# Patient Record
Sex: Female | Born: 1955
Health system: Southern US, Community
[De-identification: ages and names within clinical notes are randomized; demographics above are authoritative.]

## PROBLEM LIST (undated history)

## (undated) DIAGNOSIS — R519 Headache, unspecified: Secondary | ICD-10-CM

## (undated) DIAGNOSIS — G8929 Other chronic pain: Secondary | ICD-10-CM

## (undated) DIAGNOSIS — M25519 Pain in unspecified shoulder: Secondary | ICD-10-CM

## (undated) DIAGNOSIS — R079 Chest pain, unspecified: Secondary | ICD-10-CM

## (undated) DIAGNOSIS — B009 Herpesviral infection, unspecified: Secondary | ICD-10-CM

## (undated) DIAGNOSIS — I499 Cardiac arrhythmia, unspecified: Secondary | ICD-10-CM

## (undated) DIAGNOSIS — K589 Irritable bowel syndrome without diarrhea: Secondary | ICD-10-CM

## (undated) DIAGNOSIS — C439 Malignant melanoma of skin, unspecified: Secondary | ICD-10-CM

## (undated) DIAGNOSIS — F419 Anxiety disorder, unspecified: Secondary | ICD-10-CM

## (undated) DIAGNOSIS — F329 Major depressive disorder, single episode, unspecified: Secondary | ICD-10-CM

## (undated) DIAGNOSIS — M8580 Other specified disorders of bone density and structure, unspecified site: Secondary | ICD-10-CM

## (undated) DIAGNOSIS — D259 Leiomyoma of uterus, unspecified: Secondary | ICD-10-CM

## (undated) DIAGNOSIS — F32A Depression, unspecified: Secondary | ICD-10-CM

## (undated) DIAGNOSIS — N6019 Diffuse cystic mastopathy of unspecified breast: Secondary | ICD-10-CM

## (undated) DIAGNOSIS — N83209 Unspecified ovarian cyst, unspecified side: Secondary | ICD-10-CM

## (undated) DIAGNOSIS — R51 Headache: Secondary | ICD-10-CM

## (undated) DIAGNOSIS — K649 Unspecified hemorrhoids: Secondary | ICD-10-CM

## (undated) DIAGNOSIS — R002 Palpitations: Secondary | ICD-10-CM

## (undated) DIAGNOSIS — T7840XA Allergy, unspecified, initial encounter: Secondary | ICD-10-CM

## (undated) HISTORY — DX: Allergy, unspecified, initial encounter: T78.40XA

## (undated) HISTORY — DX: Headache: R51

## (undated) HISTORY — DX: Malignant melanoma of skin, unspecified: C43.9

## (undated) HISTORY — DX: Depression, unspecified: F32.A

## (undated) HISTORY — DX: Herpesviral infection, unspecified: B00.9

## (undated) HISTORY — DX: Other specified disorders of bone density and structure, unspecified site: M85.80

## (undated) HISTORY — DX: Leiomyoma of uterus, unspecified: D25.9

## (undated) HISTORY — DX: Pain in unspecified shoulder: M25.519

## (undated) HISTORY — DX: Major depressive disorder, single episode, unspecified: F32.9

## (undated) HISTORY — DX: Chest pain, unspecified: R07.9

## (undated) HISTORY — DX: Palpitations: R00.2

## (undated) HISTORY — DX: Cardiac arrhythmia, unspecified: I49.9

## (undated) HISTORY — DX: Irritable bowel syndrome, unspecified: K58.9

## (undated) HISTORY — DX: Unspecified ovarian cyst, unspecified side: N83.209

## (undated) HISTORY — DX: Other chronic pain: G89.29

## (undated) HISTORY — DX: Diffuse cystic mastopathy of unspecified breast: N60.19

## (undated) HISTORY — DX: Anxiety disorder, unspecified: F41.9

## (undated) HISTORY — DX: Unspecified hemorrhoids: K64.9

## (undated) HISTORY — PX: ABLATION: SHX5711

## (undated) HISTORY — DX: Headache, unspecified: R51.9

## (undated) HISTORY — PX: NASAL SEPTUM SURGERY: SHX37

---

## 1998-04-27 ENCOUNTER — Emergency Department (HOSPITAL_COMMUNITY): Admission: EM | Admit: 1998-04-27 | Discharge: 1998-04-27 | Payer: Self-pay | Admitting: Emergency Medicine

## 1998-04-30 ENCOUNTER — Other Ambulatory Visit: Admission: RE | Admit: 1998-04-30 | Discharge: 1998-04-30 | Payer: Self-pay | Admitting: Obstetrics and Gynecology

## 1998-10-19 ENCOUNTER — Emergency Department (HOSPITAL_COMMUNITY): Admission: EM | Admit: 1998-10-19 | Discharge: 1998-10-19 | Payer: Self-pay | Admitting: Emergency Medicine

## 1998-11-18 ENCOUNTER — Emergency Department (HOSPITAL_COMMUNITY): Admission: EM | Admit: 1998-11-18 | Discharge: 1998-11-18 | Payer: Self-pay | Admitting: Emergency Medicine

## 1999-01-02 ENCOUNTER — Observation Stay (HOSPITAL_COMMUNITY): Admission: AD | Admit: 1999-01-02 | Discharge: 1999-01-03 | Payer: Self-pay | Admitting: Internal Medicine

## 1999-05-04 ENCOUNTER — Other Ambulatory Visit: Admission: RE | Admit: 1999-05-04 | Discharge: 1999-05-04 | Payer: Self-pay | Admitting: Obstetrics and Gynecology

## 2000-05-12 ENCOUNTER — Other Ambulatory Visit: Admission: RE | Admit: 2000-05-12 | Discharge: 2000-05-12 | Payer: Self-pay | Admitting: Obstetrics and Gynecology

## 2000-07-07 ENCOUNTER — Encounter: Payer: Self-pay | Admitting: Internal Medicine

## 2000-07-07 ENCOUNTER — Encounter: Admission: RE | Admit: 2000-07-07 | Discharge: 2000-07-07 | Payer: Self-pay | Admitting: Internal Medicine

## 2001-01-02 ENCOUNTER — Encounter (INDEPENDENT_AMBULATORY_CARE_PROVIDER_SITE_OTHER): Payer: Self-pay | Admitting: Specialist

## 2001-01-02 ENCOUNTER — Other Ambulatory Visit: Admission: RE | Admit: 2001-01-02 | Discharge: 2001-01-02 | Payer: Self-pay | Admitting: *Deleted

## 2001-05-16 ENCOUNTER — Other Ambulatory Visit: Admission: RE | Admit: 2001-05-16 | Discharge: 2001-05-16 | Payer: Self-pay | Admitting: Obstetrics and Gynecology

## 2001-05-19 ENCOUNTER — Encounter: Admission: RE | Admit: 2001-05-19 | Discharge: 2001-05-19 | Payer: Self-pay | Admitting: Internal Medicine

## 2001-05-19 ENCOUNTER — Encounter: Payer: Self-pay | Admitting: Internal Medicine

## 2002-05-22 ENCOUNTER — Encounter: Admission: RE | Admit: 2002-05-22 | Discharge: 2002-05-22 | Payer: Self-pay | Admitting: Internal Medicine

## 2002-05-22 ENCOUNTER — Encounter: Payer: Self-pay | Admitting: Internal Medicine

## 2004-07-01 ENCOUNTER — Encounter: Admission: RE | Admit: 2004-07-01 | Discharge: 2004-07-01 | Payer: Self-pay | Admitting: Obstetrics and Gynecology

## 2006-03-14 ENCOUNTER — Other Ambulatory Visit: Admission: RE | Admit: 2006-03-14 | Discharge: 2006-03-14 | Payer: Self-pay | Admitting: Gynecology

## 2006-04-04 ENCOUNTER — Encounter: Admission: RE | Admit: 2006-04-04 | Discharge: 2006-04-04 | Payer: Self-pay | Admitting: Gynecology

## 2007-03-21 ENCOUNTER — Other Ambulatory Visit: Admission: RE | Admit: 2007-03-21 | Discharge: 2007-03-21 | Payer: Self-pay | Admitting: Gynecology

## 2007-03-28 ENCOUNTER — Encounter: Admission: RE | Admit: 2007-03-28 | Discharge: 2007-03-28 | Payer: Self-pay | Admitting: Gynecology

## 2007-04-12 ENCOUNTER — Emergency Department (HOSPITAL_COMMUNITY): Admission: EM | Admit: 2007-04-12 | Discharge: 2007-04-12 | Payer: Self-pay | Admitting: Emergency Medicine

## 2008-04-15 ENCOUNTER — Other Ambulatory Visit: Admission: RE | Admit: 2008-04-15 | Discharge: 2008-04-15 | Payer: Self-pay | Admitting: Gynecology

## 2008-05-21 ENCOUNTER — Encounter: Admission: RE | Admit: 2008-05-21 | Discharge: 2008-05-21 | Payer: Self-pay | Admitting: Gynecology

## 2009-06-24 ENCOUNTER — Encounter: Admission: RE | Admit: 2009-06-24 | Discharge: 2009-06-24 | Payer: Self-pay | Admitting: Gynecology

## 2010-01-12 ENCOUNTER — Encounter: Admission: RE | Admit: 2010-01-12 | Discharge: 2010-01-12 | Payer: Self-pay | Admitting: Gastroenterology

## 2010-01-19 ENCOUNTER — Encounter: Admission: RE | Admit: 2010-01-19 | Discharge: 2010-01-19 | Payer: Self-pay | Admitting: Internal Medicine

## 2011-07-01 ENCOUNTER — Other Ambulatory Visit: Payer: Self-pay | Admitting: Gynecology

## 2011-07-01 ENCOUNTER — Other Ambulatory Visit: Payer: Self-pay | Admitting: Internal Medicine

## 2011-07-01 DIAGNOSIS — Z1231 Encounter for screening mammogram for malignant neoplasm of breast: Secondary | ICD-10-CM

## 2011-07-20 ENCOUNTER — Ambulatory Visit
Admission: RE | Admit: 2011-07-20 | Discharge: 2011-07-20 | Disposition: A | Discharge status: Home / Self Care | Payer: 59 | Source: Ambulatory Visit | Attending: Internal Medicine | Admitting: Internal Medicine

## 2011-07-20 DIAGNOSIS — Z1231 Encounter for screening mammogram for malignant neoplasm of breast: Secondary | ICD-10-CM

## 2012-01-25 ENCOUNTER — Other Ambulatory Visit: Payer: Self-pay | Admitting: Internal Medicine

## 2012-01-25 DIAGNOSIS — R911 Solitary pulmonary nodule: Secondary | ICD-10-CM

## 2012-02-01 ENCOUNTER — Ambulatory Visit
Admission: RE | Admit: 2012-02-01 | Discharge: 2012-02-01 | Disposition: A | Discharge status: Home / Self Care | Payer: 59 | Source: Ambulatory Visit | Attending: Internal Medicine | Admitting: Internal Medicine

## 2012-02-01 DIAGNOSIS — R911 Solitary pulmonary nodule: Secondary | ICD-10-CM

## 2012-08-02 ENCOUNTER — Other Ambulatory Visit: Payer: Self-pay | Admitting: Internal Medicine

## 2012-08-02 DIAGNOSIS — Z1231 Encounter for screening mammogram for malignant neoplasm of breast: Secondary | ICD-10-CM

## 2012-08-16 ENCOUNTER — Ambulatory Visit
Admission: RE | Admit: 2012-08-16 | Discharge: 2012-08-16 | Disposition: A | Discharge status: Home / Self Care | Payer: 59 | Source: Ambulatory Visit | Attending: Internal Medicine | Admitting: Internal Medicine

## 2012-08-16 DIAGNOSIS — Z1231 Encounter for screening mammogram for malignant neoplasm of breast: Secondary | ICD-10-CM

## 2013-01-24 ENCOUNTER — Other Ambulatory Visit: Payer: Self-pay | Admitting: Internal Medicine

## 2013-01-24 DIAGNOSIS — R911 Solitary pulmonary nodule: Secondary | ICD-10-CM

## 2013-01-29 ENCOUNTER — Ambulatory Visit
Admission: RE | Admit: 2013-01-29 | Discharge: 2013-01-29 | Disposition: A | Discharge status: Home / Self Care | Payer: 59 | Source: Ambulatory Visit | Attending: Internal Medicine | Admitting: Internal Medicine

## 2013-01-29 DIAGNOSIS — R911 Solitary pulmonary nodule: Secondary | ICD-10-CM

## 2013-07-27 ENCOUNTER — Other Ambulatory Visit: Payer: Self-pay

## 2013-07-27 DIAGNOSIS — Z1231 Encounter for screening mammogram for malignant neoplasm of breast: Secondary | ICD-10-CM

## 2013-08-22 ENCOUNTER — Ambulatory Visit
Admission: RE | Admit: 2013-08-22 | Discharge: 2013-08-22 | Disposition: A | Discharge status: Home / Self Care | Payer: 59 | Source: Ambulatory Visit

## 2013-08-22 DIAGNOSIS — Z1231 Encounter for screening mammogram for malignant neoplasm of breast: Secondary | ICD-10-CM

## 2014-11-26 DIAGNOSIS — R002 Palpitations: Secondary | ICD-10-CM

## 2014-11-26 DIAGNOSIS — R079 Chest pain, unspecified: Secondary | ICD-10-CM

## 2014-11-26 DIAGNOSIS — M25519 Pain in unspecified shoulder: Secondary | ICD-10-CM

## 2014-11-26 HISTORY — DX: Chest pain, unspecified: R07.9

## 2014-11-26 HISTORY — DX: Palpitations: R00.2

## 2014-11-26 HISTORY — DX: Pain in unspecified shoulder: M25.519

## 2014-12-17 ENCOUNTER — Other Ambulatory Visit: Payer: Self-pay

## 2014-12-17 DIAGNOSIS — Z1231 Encounter for screening mammogram for malignant neoplasm of breast: Secondary | ICD-10-CM

## 2014-12-18 ENCOUNTER — Ambulatory Visit
Admission: RE | Admit: 2014-12-18 | Discharge: 2014-12-18 | Disposition: A | Discharge status: Home / Self Care | Payer: 59 | Source: Ambulatory Visit

## 2014-12-18 DIAGNOSIS — Z1231 Encounter for screening mammogram for malignant neoplasm of breast: Secondary | ICD-10-CM

## 2014-12-25 ENCOUNTER — Ambulatory Visit (INDEPENDENT_AMBULATORY_CARE_PROVIDER_SITE_OTHER): Payer: 59 | Admitting: Internal Medicine

## 2014-12-25 ENCOUNTER — Encounter: Payer: Self-pay | Admitting: Internal Medicine

## 2014-12-25 VITALS — BP 124/82 | HR 75 | Ht 72.0 in | Wt 129.2 lb

## 2014-12-25 DIAGNOSIS — R002 Palpitations: Secondary | ICD-10-CM

## 2014-12-25 DIAGNOSIS — R0789 Other chest pain: Secondary | ICD-10-CM | POA: Insufficient documentation

## 2014-12-25 DIAGNOSIS — I471 Supraventricular tachycardia: Secondary | ICD-10-CM

## 2014-12-25 NOTE — Assessment & Plan Note (Signed)
The etiology of her palpitations is unclear. It would be extremely unlikely for her to have recurrent SVT. That she is only had a couple of episodes makes me think we should undergo watchful waiting. If her palpitations return and increase in frequency, I would recommend that she wear a cardiac monitor.

## 2014-12-25 NOTE — Assessment & Plan Note (Signed)
She has a history of SVT, and underwent successful catheter ablation in 2001. It seems unlikely that she has recurrent SVT. She will undergo watchful waiting. She is encouraged to avoid caffeine use or alcohol use.

## 2014-12-25 NOTE — Patient Instructions (Signed)
Your physician has requested that you have an exercise tolerance test. For further information please visit [email or web address]. Please also follow instruction sheet, as given.   

## 2014-12-25 NOTE — Assessment & Plan Note (Signed)
She has few if any risk factors for coronary disease. Her symptoms however are associated with exertion. She will undergo exercise treadmill testing. Additional recommendations will be made based on the results of her stress test.

## 2014-12-25 NOTE — Progress Notes (Signed)
HPI Sharon Potter returns today after a long absence from our arrhythmia clinic. She is a very pleasant 58 year old woman with a remote history of tachycardia palpitations and documented SVT. She underwent catheter ablation over 16 years ago. In the interim, she has done well until approximately 3 weeks ago. She had been under increased stress, and had not slept for a well and noted 3 episodes of tachycardia palpitations. Her episodes did not feel like they did when she underwent catheter ablation. In addition, she has had substernal chest pressure. This is at times related to standing or exertion, and typically resolves with rest. No other symptoms noted. She has not had syncope or peripheral edema. Her breathing has been okay.  Allergies  Allergen Reactions  . Penicillin G Benzathine     Severe rash   . Penicillins     Severe rash   . Peroxyl [Hydrogen Peroxide-Benzy Alc]     Severe facial swelling, bumps   . Fosamax [Alendronate]     HA  . Lopressor [Metoprolol Tartrate]     fatigue  . Sulfa Antibiotics     rash  . Sulfamethoxazole-Trimethoprim     rash     Current Outpatient Prescriptions  Medication Sig Dispense Refill  . fluticasone (FLONASE) 50 MCG/ACT nasal spray Place 1 spray into both nostrils daily.    . VENTOLIN HFA 108 (90 BASE) MCG/ACT inhaler Inhale 2 puffs into the lungs every 4 (four) hours as needed. Shortness of breath or wheezing     No current facility-administered medications for this visit.     Past Medical History  Diagnosis Date  . Depression     no meds  . Anxiety   . IBS (irritable bowel syndrome)     Dr.Robert Vertell Limber in [facility] 2006 colon and EGD normal  CT 2011  . Arrhythmia     history of SVT status post ablation in 2000   . Chronic headache   . HSV (herpes simplex virus) infection     outbreaks  . Fibrocystic disease of breast   . Hemorrhoids   . Ovarian cyst     left followed by Dr Sela Hilding   . Skin melanoma     on back  superficial only, Dr Allyson Sabal  . Uterine fibroid     CT scan 1/11  . Allergy     RAD - PRN ALB MDI   . Osteopenia     BMD- 3/13  . Palpitations 11/26/2014    c/o HR slow and fast, last 2-3 weeks. Had electrical ablation in 2001  . Chest pain 11/26/2014  . Shoulder pain 11/26/2014    ROS:   All systems reviewed and negative except as noted in the HPI.   Past Surgical History  Procedure Laterality Date  . Ablation  2000 OR 2001  . Nasal septum surgery      repair     Family History  Problem Relation Age of Onset  . Cancer Mother     lung  . Heart Problems Father     MVR and MVP  . Cancer Father     skin  . Cancer Maternal Aunt     breast  . Cancer Maternal Grandmother     breast  . Pulmonary embolism Paternal Grandfather   . Cancer Daughter     skin  . Cancer Maternal Aunt     breast     History   Social History  . Marital Status: Married  Spouse Name: N/A    Number of Children: N/A  . Years of Education: N/A   Occupational History  . Not on file.   Social History Main Topics  . Smoking status: Never Smoker   . Smokeless tobacco: Not on file  . Alcohol Use: 0.0 oz/week    0 Not specified per week     Comment: Heavy in the past. Wine 2 glasses occasionally   . Drug Use: Not on file  . Sexual Activity: Not on file   Other Topics Concern  . Not on file   Social History Narrative   Tobacco use cigarettes: Never smoked    No smoking   No Tobacco Exposure   Alcohol : yes Heavy in the past: wine 2 glasses    Caffeine: yes   No recreational drug use   No diet   No exercise   Occupation : employed work at [employer] , in [employer] in [place],  [place] Status  : Married    Children girls 1   Seat belt use :yes     BP 124/82 mmHg  Pulse 75  Ht 6' (1.829 m)  Wt 129 lb 3.2 oz (58.605 kg)  BMI 17.52 kg/m2  Physical Exam:  Well appearing 58 year old woman, NAD HEENT: Unremarkable Neck:  No JVD, no  thyromegally Lymphatics:  No adenopathy Back:  No CVA tenderness Lungs:  Clear, with no wheezes, rales, or rhonchi. HEART:  Regular rate rhythm, no murmurs, no rubs, no clicks Abd:  soft, positive bowel sounds, no organomegally, no rebound, no guarding Ext:  2 plus pulses, no edema, no cyanosis, no clubbing Skin:  No rashes no nodules Neuro:  CN II through XII intact, motor grossly intact  EKG - normal sinus rhythm, normal axis and intervals.  Assess/Plan:

## 2015-01-01 ENCOUNTER — Encounter: Payer: Self-pay | Admitting: Internal Medicine

## 2015-01-02 ENCOUNTER — Encounter: Payer: 59 | Admitting: Internal Medicine

## 2015-01-14 ENCOUNTER — Ambulatory Visit (INDEPENDENT_AMBULATORY_CARE_PROVIDER_SITE_OTHER): Payer: 59 | Admitting: Internal Medicine

## 2015-01-14 DIAGNOSIS — R002 Palpitations: Secondary | ICD-10-CM

## 2015-01-14 DIAGNOSIS — R0789 Other chest pain: Secondary | ICD-10-CM

## 2015-01-14 NOTE — Progress Notes (Signed)
Exercise Treadmill Test  Pre-Exercise Testing Evaluation Rhythm: normal sinus  Rate: 83 bpm     Test  Exercise Tolerance Test Ordering MD: Cristopher Peru, MD  Interpreting MD: Cristopher Peru, MD  Unique Test No: 1  Treadmill:  1  Indication for ETT: chest pain - rule out ischemia  Contraindication to ETT: No   Stress Modality: exercise - treadmill  Cardiac Imaging Performed: non   Protocol: standard Bruce - maximal  Max BP:  205/91  Max MPHR (bpm):  161 85% MPR (bpm):  137  MPHR obtained (bpm):  164 % MPHR obtained:  101  Reached 85% MPHR (min:sec):  9:21 Total Exercise Time (min-sec):  12:00  Workload in METS:  13.4 Borg Scale: 15  Reason ETT Terminated:  desired heart rate attained    ST Segment Analysis At Rest: normal ST segments - no evidence of significant ST depression With Exercise: no evidence of significant ST depression  Other Information Arrhythmia:  No Angina during ETT:  absent (0) Quality of ETT:  diagnostic  ETT Interpretation:  normal - no evidence of ischemia by ST analysis  Comments: Clinically and electrically negative  Recommendations: Continue current meds

## 2015-10-27 ENCOUNTER — Other Ambulatory Visit: Payer: Self-pay

## 2015-10-27 DIAGNOSIS — Z1231 Encounter for screening mammogram for malignant neoplasm of breast: Secondary | ICD-10-CM

## 2015-12-24 ENCOUNTER — Ambulatory Visit
Admission: RE | Admit: 2015-12-24 | Discharge: 2015-12-24 | Disposition: A | Discharge status: Home / Self Care | Payer: 59 | Source: Ambulatory Visit

## 2015-12-24 DIAGNOSIS — Z1231 Encounter for screening mammogram for malignant neoplasm of breast: Secondary | ICD-10-CM

## 2016-11-12 ENCOUNTER — Other Ambulatory Visit: Payer: Self-pay | Admitting: Gastroenterology

## 2016-12-29 ENCOUNTER — Other Ambulatory Visit: Payer: Self-pay | Admitting: Internal Medicine

## 2016-12-29 DIAGNOSIS — Z1159 Encounter for screening for other viral diseases: Secondary | ICD-10-CM | POA: Diagnosis not present

## 2016-12-29 DIAGNOSIS — Z0000 Encounter for general adult medical examination without abnormal findings: Secondary | ICD-10-CM | POA: Diagnosis not present

## 2016-12-29 DIAGNOSIS — R2232 Localized swelling, mass and lump, left upper limb: Secondary | ICD-10-CM

## 2016-12-29 DIAGNOSIS — R7309 Other abnormal glucose: Secondary | ICD-10-CM | POA: Diagnosis not present

## 2017-01-10 ENCOUNTER — Ambulatory Visit
Admission: RE | Admit: 2017-01-10 | Discharge: 2017-01-10 | Disposition: A | Discharge status: Home / Self Care | Payer: Commercial Managed Care - HMO | Source: Ambulatory Visit | Attending: Internal Medicine | Admitting: Internal Medicine

## 2017-01-10 DIAGNOSIS — R2232 Localized swelling, mass and lump, left upper limb: Secondary | ICD-10-CM | POA: Diagnosis not present

## 2017-02-24 DIAGNOSIS — L821 Other seborrheic keratosis: Secondary | ICD-10-CM | POA: Diagnosis not present

## 2017-02-24 DIAGNOSIS — D225 Melanocytic nevi of trunk: Secondary | ICD-10-CM | POA: Diagnosis not present

## 2017-02-24 DIAGNOSIS — L719 Rosacea, unspecified: Secondary | ICD-10-CM | POA: Diagnosis not present

## 2017-03-11 DIAGNOSIS — R5383 Other fatigue: Secondary | ICD-10-CM | POA: Diagnosis not present

## 2017-03-11 DIAGNOSIS — R21 Rash and other nonspecific skin eruption: Secondary | ICD-10-CM | POA: Diagnosis not present

## 2017-05-18 ENCOUNTER — Other Ambulatory Visit: Payer: Self-pay | Admitting: Internal Medicine

## 2017-05-18 DIAGNOSIS — Z1231 Encounter for screening mammogram for malignant neoplasm of breast: Secondary | ICD-10-CM

## 2017-05-25 ENCOUNTER — Ambulatory Visit
Admission: RE | Admit: 2017-05-25 | Discharge: 2017-05-25 | Disposition: A | Discharge status: Home / Self Care | Payer: Commercial Managed Care - HMO | Source: Ambulatory Visit | Attending: Internal Medicine | Admitting: Internal Medicine

## 2017-05-25 DIAGNOSIS — Z1231 Encounter for screening mammogram for malignant neoplasm of breast: Secondary | ICD-10-CM

## 2017-06-09 DIAGNOSIS — W57XXXA Bitten or stung by nonvenomous insect and other nonvenomous arthropods, initial encounter: Secondary | ICD-10-CM | POA: Diagnosis not present

## 2017-06-09 DIAGNOSIS — R5383 Other fatigue: Secondary | ICD-10-CM | POA: Diagnosis not present

## 2017-06-09 DIAGNOSIS — Z7689 Persons encountering health services in other specified circumstances: Secondary | ICD-10-CM | POA: Diagnosis not present

## 2017-06-09 DIAGNOSIS — S30860A Insect bite (nonvenomous) of lower back and pelvis, initial encounter: Secondary | ICD-10-CM | POA: Diagnosis not present

## 2017-07-27 DIAGNOSIS — Z01419 Encounter for gynecological examination (general) (routine) without abnormal findings: Secondary | ICD-10-CM | POA: Diagnosis not present

## 2017-07-27 DIAGNOSIS — Z6824 Body mass index (BMI) 24.0-24.9, adult: Secondary | ICD-10-CM | POA: Diagnosis not present

## 2017-08-09 DIAGNOSIS — L820 Inflamed seborrheic keratosis: Secondary | ICD-10-CM | POA: Diagnosis not present

## 2017-11-02 DIAGNOSIS — Z23 Encounter for immunization: Secondary | ICD-10-CM | POA: Diagnosis not present

## 2017-12-15 ENCOUNTER — Other Ambulatory Visit: Payer: Self-pay

## 2017-12-15 ENCOUNTER — Emergency Department (HOSPITAL_COMMUNITY)
Admission: EM | Admit: 2017-12-15 | Discharge: 2017-12-15 | Disposition: A | Discharge status: Home / Self Care | Payer: 59 | Attending: Physician Assistant | Admitting: Physician Assistant

## 2017-12-15 ENCOUNTER — Emergency Department (HOSPITAL_COMMUNITY): Payer: 59

## 2017-12-15 DIAGNOSIS — R000 Tachycardia, unspecified: Secondary | ICD-10-CM | POA: Diagnosis not present

## 2017-12-15 DIAGNOSIS — I471 Supraventricular tachycardia: Secondary | ICD-10-CM | POA: Diagnosis not present

## 2017-12-15 DIAGNOSIS — I469 Cardiac arrest, cause unspecified: Secondary | ICD-10-CM | POA: Diagnosis not present

## 2017-12-15 LAB — BASIC METABOLIC PANEL
ANION GAP: 8 (ref 5–15)
BUN: 14 mg/dL (ref 6–20)
CALCIUM: 9.5 mg/dL (ref 8.9–10.3)
CO2: 23 mmol/L (ref 22–32)
Chloride: 104 mmol/L (ref 101–111)
Creatinine, Ser: 1.02 mg/dL — ABNORMAL HIGH (ref 0.44–1.00)
GFR, EST NON AFRICAN AMERICAN: 58 mL/min — AB (ref >60)
Glucose, Bld: 167 mg/dL — ABNORMAL HIGH (ref 65–99)
Potassium: 4 mmol/L (ref 3.5–5.1)
SODIUM: 135 mmol/L (ref 135–145)

## 2017-12-15 LAB — CBC
HCT: 42.2 % (ref 36.0–46.0)
HEMOGLOBIN: 14.4 g/dL (ref 12.0–15.0)
MCH: 30.6 pg (ref 26.0–34.0)
MCHC: 34.1 g/dL (ref 30.0–36.0)
MCV: 89.8 fL (ref 78.0–100.0)
PLATELETS: 225 10*3/uL (ref 150–400)
RBC: 4.7 MIL/uL (ref 3.87–5.11)
RDW: 12.2 % (ref 11.5–15.5)
WBC: 8.5 10*3/uL (ref 4.0–10.5)

## 2017-12-15 MED ORDER — ADENOSINE 6 MG/2ML IV SOLN
INTRAVENOUS | Status: AC
  Filled 2017-12-15: qty 6

## 2017-12-15 MED ORDER — ADENOSINE 6 MG/2ML IV SOLN
12.0000 mg | Freq: Once | INTRAVENOUS | Status: AC
  Administered 2017-12-15: 12 mg via INTRAVENOUS

## 2017-12-15 MED ORDER — ADENOSINE 6 MG/2ML IV SOLN
6.0000 mg | Freq: Once | INTRAVENOUS | Status: AC
  Administered 2017-12-15: 6 mg via INTRAVENOUS

## 2017-12-15 NOTE — Discharge Instructions (Signed)
You were in SVT today.  We are able to convert you to  regular sinus rhythm.  Please follow-up with your cardiologist in the coming days.  We do not think that you require any medications at this time.  Please follow-up with any concerns.

## 2017-12-15 NOTE — ED Triage Notes (Signed)
Patient c/o sob and lightheadedness x 2 hours. SVT noted on EKG.

## 2017-12-15 NOTE — ED Provider Notes (Signed)
[facility] EMERGENCY DEPARTMENT Provider Note   CSN: [id number] Arrival date & time: 12/15/17  1328     History   Chief Complaint Chief Complaint  Patient presents with  . Tachycardia    HPI Sharon Potter is a 61 y.o. female.  HPI   Patient is a 61 year old female with history of SVT and ablation in 2000.  Patient found to have high heart rate today at noon.  Came here in the emergency department at 2 PM.  No known insults.  Patient is not on any controller medications at home.  Patient has no chest pain.  Is feeling of fluttering, uncomfortably fast heart rate.  Past Medical History:  Diagnosis Date  . Allergy    RAD - PRN ALB MDI   . Anxiety   . Arrhythmia    history of SVT status post ablation in 2000   . Chest pain 11/26/2014  . Chronic headache   . Depression    no meds  . Fibrocystic disease of breast   . Hemorrhoids   . HSV (herpes simplex virus) infection    outbreaks  . IBS (irritable bowel syndrome)    Dr.Robert Vertell Limber in [facility] 2006 colon and EGD normal  CT 2011  . Osteopenia    BMD- 3/13  . Ovarian cyst    left followed by Dr Sela Hilding   . Palpitations 11/26/2014   c/o HR slow and fast, last 2-3 weeks. Had electrical ablation in 2001  . Shoulder pain 11/26/2014  . Skin melanoma ([place])    on back superficial only, Dr Allyson Sabal  . Uterine fibroid    CT scan 1/11    Patient Active Problem List   Diagnosis Date Noted  . Palpitations 12/25/2014  . Pressure in chest 12/25/2014  . SVT (supraventricular tachycardia) ([place]) 12/25/2014    Past Surgical History:  Procedure Laterality Date  . ABLATION  2000 OR 2001  . NASAL SEPTUM SURGERY     repair    OB History    No data available       Home Medications    Prior to Admission medications   Medication Sig Start Date End Date Taking? Authorizing Provider  fluticasone (FLONASE) 50 MCG/ACT nasal spray Place 1 spray into both nostrils daily. 12/18/14   [provider]   VENTOLIN HFA 108 (90 BASE) MCG/ACT inhaler Inhale 2 puffs into the lungs every 4 (four) hours as needed. Shortness of breath or wheezing 12/18/14   [provider]    Family History Family History  Problem Relation Age of Onset  . Cancer Maternal Aunt        breast  . Cancer Maternal Aunt        breast  . Cancer Mother        lung  . Heart Problems Father        MVR and MVP  . Cancer Father        skin  . Cancer Maternal Grandmother        breast  . Pulmonary embolism Paternal Grandfather   . Cancer Daughter        skin    Social History Social History   Tobacco Use  . Smoking status: Never Smoker  Substance Use Topics  . Alcohol use: Yes    Alcohol/week: 0.0 oz    Comment: Heavy in the past. Wine 2 glasses occasionally   . Drug use: Not on file     Allergies   Penicillin  g benzathine; Penicillins; Peroxyl [hydrogen peroxide-benzy alc]; Fosamax [alendronate]; Lopressor [metoprolol tartrate]; Sulfa antibiotics; and Sulfamethoxazole-trimethoprim   Review of Systems Review of Systems  Constitutional: Positive for fatigue. Negative for activity change and fever.  Respiratory: Positive for shortness of breath.   Cardiovascular: Positive for palpitations. Negative for chest pain.  Gastrointestinal: Negative for abdominal pain.  All other systems reviewed and are negative.    Physical Exam Updated Vital Signs BP 136/89 (BP Location: Right Arm)   Pulse (!) 185   Temp 97.6 F (36.4 C) (Oral)   Resp 18   Ht 5\' 3"  (1.6 m)   Wt 61.2 kg (135 lb)   SpO2 97%   BMI 23.91 kg/m   Physical Exam  Constitutional: She is oriented to person, place, and time. She appears well-developed and well-nourished.  HENT:  Head: Normocephalic and atraumatic.  Eyes: Right eye exhibits no discharge.  Cardiovascular: Regular rhythm and normal heart sounds.  No murmur heard. Tachycardic.  Pulmonary/Chest: Effort normal and breath sounds normal. She has no wheezes. She has  no rales.  Abdominal: Soft. She exhibits no distension. There is no tenderness.  Neurological: She is oriented to person, place, and time.  Skin: Skin is warm and dry. She is not diaphoretic.  Psychiatric: She has a normal mood and affect.  Nursing note and vitals reviewed.    ED Treatments / Results  Labs (all labs ordered are listed, but only abnormal results are displayed) Labs Reviewed  BASIC METABOLIC PANEL - Abnormal; Notable for the following components:      Result Value   Glucose, Bld 167 (*)    Creatinine, Ser 1.02 (*)    GFR calc non Af Amer 58 (*)    All other components within normal limits  CBC    EKG  EKG Interpretation None       Radiology No results found.  Procedures .Cardioversion Date/Time: 12/15/2017 2:27 PM Performed by: Macarthur Critchley, MD Authorized by: Macarthur Critchley, MD   Consent:    Consent obtained:  Verbal   Consent given by:  Patient and spouse Pre-procedure details:    Cardioversion basis:  Emergent   Rhythm:  Supraventricular tachycardia   Electrode placement:  Anterior-posterior Patient sedated: No Attempt one:    Shock outcome:  Conversion to normal sinus rhythm Post-procedure details:    Patient status:  Awake   Patient tolerance of procedure:  Tolerated well, no immediate complications Comments:     6 mg and then 12 mg of adenosine.  Converted with 12 mg.      (including critical care time)  Medications Ordered in ED Medications  adenosine (ADENOCARD) 6 MG/2ML injection 6 mg (6 mg Intravenous Given 12/15/17 1353)  adenosine (ADENOCARD) 6 MG/2ML injection 12 mg (12 mg Intravenous Given 12/15/17 1356)     Initial Impression / Assessment and Plan / ED Course  I have reviewed the triage vital signs and the nursing notes.  Pertinent labs & imaging results that were available during my care of the patient were reviewed by me and considered in my medical decision making (see chart for details).      Patient is a 61 year old female with history of SVT and ablation in 2000.  Patient found to have high heart rate today at noon.  Came here in the emergency department at 2 PM.  No known insults.  Patient is not on any controller medications at home.  Patient has no chest pain.  Is feeling of fluttering, uncomfortably fast  heart rate.  2:27 PM Converted with 12 mg of adenosine.  Patient now comfortable.  Patient x-rays normal.  Baseline labs are normal.  We will have her follow-up with her cardiologist.  Final Clinical Impressions(s) / ED Diagnoses   Final diagnoses:  None    ED Discharge Orders    None       Macarthur Critchley, MD 12/16/17 [phone]

## 2017-12-15 NOTE — ED Notes (Signed)
Patient transported to X-ray via wheelchair 

## 2017-12-21 ENCOUNTER — Ambulatory Visit: Payer: 59 | Admitting: Internal Medicine

## 2017-12-21 ENCOUNTER — Encounter: Payer: Self-pay | Admitting: Internal Medicine

## 2017-12-21 VITALS — BP 124/80 | HR 68 | Ht 63.0 in | Wt 135.0 lb

## 2017-12-21 DIAGNOSIS — I471 Supraventricular tachycardia: Secondary | ICD-10-CM | POA: Diagnosis not present

## 2017-12-21 MED ORDER — METOPROLOL TARTRATE 50 MG PO TABS: 50.0000 mg | ORAL_TABLET | Freq: Every day | ORAL | 3 refills | Status: DC | PRN

## 2017-12-21 NOTE — Progress Notes (Signed)
HPI Sharon Potter returns today for ongoing evaluation of SVT and associated chest pressure. She is a 61 yo woman with a h/o SVT who underwent EP study and ablation over 18 years ago. I currently do not have access to those records. She presented to the ED less than a week ago with palpitations and chest pressure and was in SVT at 187/min. She was treated with 12 mg of IV adenosine restoring NSR. She admits to some difficulty with sleeping. No ETOH or caffeine excess. Allergies  Allergen Reactions  . Penicillins Hives, Swelling and Rash    Has patient had a PCN reaction causing immediate rash, facial/tongue/throat swelling, SOB or lightheadedness with hypotension: Yes Has patient had a PCN reaction causing severe rash involving mucus membranes or skin necrosis: Yes Has patient had a PCN reaction that required hospitalization: Yes Has patient had a PCN reaction occurring within the last 10 years: No If all of the above answers are "NO", then may proceed with Cephalosporin use.    Charmaine Downs Peroxide-Benzy Alc] Swelling and Rash    Severe facial swelling, bumps   . Fosamax [Alendronate] Other (See Comments)    HA  . Lopressor [Metoprolol Tartrate] Other (See Comments)    fatigue  . Sulfa Antibiotics Rash    rash     Current Outpatient Medications  Medication Sig Dispense Refill  . fluticasone (FLONASE) 50 MCG/ACT nasal spray Place 1 spray into both nostrils daily as needed for allergies.     . Probiotic Product (PROBIOTIC PO) Take 1 tablet by mouth daily as needed (supplement).    . VENTOLIN HFA 108 (90 BASE) MCG/ACT inhaler Inhale 2 puffs into the lungs every 4 (four) hours as needed. Shortness of breath or wheezing    . metoprolol tartrate (LOPRESSOR) 50 MG tablet Take 1 tablet (50 mg total) by mouth daily as needed (Do not take more than 1 tablet in 24 hours). 30 tablet 3   No current facility-administered medications for this visit.      Past Medical History:    Diagnosis Date  . Allergy    RAD - PRN ALB MDI   . Anxiety   . Arrhythmia    history of SVT status post ablation in 2000   . Chest pain 11/26/2014  . Chronic headache   . Depression    no meds  . Fibrocystic disease of breast   . Hemorrhoids   . HSV (herpes simplex virus) infection    outbreaks  . IBS (irritable bowel syndrome)    Dr.Robert Vertell Limber in [facility] 2006 colon and EGD normal  CT 2011  . Osteopenia    BMD- 3/13  . Ovarian cyst    left followed by Dr Sela Hilding   . Palpitations 11/26/2014   c/o HR slow and fast, last 2-3 weeks. Had electrical ablation in 2001  . Shoulder pain 11/26/2014  . Skin melanoma ([place])    on back superficial only, Dr Allyson Sabal  . Uterine fibroid    CT scan 1/11    ROS:   All systems reviewed and negative except as noted in the HPI.   Past Surgical History:  Procedure Laterality Date  . ABLATION  2000 OR 2001  . NASAL SEPTUM SURGERY     repair     Family History  Problem Relation Age of Onset  . Cancer Maternal Aunt        breast  . Cancer Maternal Aunt  breast  . Cancer Mother        lung  . Heart Problems Father        MVR and MVP  . Cancer Father        skin  . Cancer Maternal Grandmother        breast  . Pulmonary embolism Paternal Grandfather   . Cancer Daughter        skin     Social History   Socioeconomic History  . Marital status: Married    Spouse name: Not on file  . Number of children: Not on file  . Years of education: Not on file  . Highest education level: Not on file  Social Needs  . Financial resource strain: Not on file  . Food insecurity - worry: Not on file  . Food insecurity - inability: Not on file  . Transportation needs - medical: Not on file  . Transportation needs - non-medical: Not on file  Occupational History  . Not on file  Tobacco Use  . Smoking status: Never Smoker  . Smokeless tobacco: Never Used  Substance and Sexual Activity  . Alcohol use: Yes    Alcohol/week: 0.0  oz    Comment: Heavy in the past. Wine 2 glasses occasionally   . Drug use: Not on file  . Sexual activity: Not on file  Other Topics Concern  . Not on file  Social History Narrative   Tobacco use cigarettes: Never smoked    No smoking   No Tobacco Exposure   Alcohol : yes Heavy in the past: wine 2 glasses    Caffeine: yes   No recreational drug use   No diet   No exercise   Occupation : employed work at [employer] , in [employer] in [place],  [place] Status  : Married    Children girls 1   Seat belt use :yes     BP 124/80   Pulse 68   Ht 5\' 3"  (1.6 m)   Wt 135 lb (61.2 kg)   SpO2 97%   BMI 23.91 kg/m   Physical Exam:  Well appearing NAD HEENT: Unremarkable Neck:  No JVD, no thyromegally Lymphatics:  No adenopathy Back:  No CVA tenderness Lungs:  Clear HEART:  Regular rate rhythm, no murmurs, no rubs, no clicks Abd:  soft, positive bowel sounds, no organomegally, no rebound, no guarding Ext:  2 plus pulses, no edema, no cyanosis, no clubbing Skin:  No rashes no nodules Neuro:  CN II through XII intact, motor grossly intact  EKG - none  Assess/Plan: 1. SVT - I have discussed the treatment options with the patient. PRN beta blocker, daily beta blocker and catheter ablation were all reviewed. We will start out with the prn beta blocker. 2. Chest tightness - this occurs only with SVT and not with exertion. She will undergo watchful waiting.  Mikle [email or web address].D.

## 2017-12-21 NOTE — Patient Instructions (Addendum)
Medication Instructions:  Your physician has recommended you make the following change in your medication: 1.  Start taking metoprolol tartrate 50 mg (one tablet) as needed for SVT.  Do not take more than one tablet daily.  Labwork: None ordered.  Testing/Procedures: None ordered.  Follow-Up: Your physician wants you to follow-up in: 3 months with Dr. Lovena Le.  Any Other Special Instructions Will Be Listed Below (If Applicable).  If you need a refill on your cardiac medications before your next appointment, please call your pharmacy.  Metoprolol tablets What is this medicine? METOPROLOL (me TOE proe lole) is a beta-blocker. Beta-blockers reduce the workload on the heart and help it to beat more regularly. This medicine is used to treat high blood pressure and to prevent chest pain. It is also used to after a heart attack and to prevent an additional heart attack from occurring. This medicine may be used for other purposes; ask your health care provider or pharmacist if you have questions. COMMON BRAND NAME(S): Lopressor What should I tell my health care provider before I take this medicine? They need to know if you have any of these conditions: -diabetes -heart or vessel disease like slow heart rate, worsening heart failure, heart block, sick sinus syndrome or Raynaud's disease -kidney disease -liver disease -lung or breathing disease, like asthma or emphysema -pheochromocytoma -thyroid disease -an unusual or allergic reaction to metoprolol, other beta-blockers, medicines, foods, dyes, or preservatives -pregnant or trying to get pregnant -breast-feeding How should I use this medicine? Take this medicine by mouth with a drink of water. Follow the directions on the prescription label. Take this medicine immediately after meals. Take your doses at regular intervals. Do not take more medicine than directed. Do not stop taking this medicine suddenly. This could lead to serious  heart-related effects. Talk to your pediatrician regarding the use of this medicine in children. Special care may be needed. Overdosage: If you think you have taken too much of this medicine contact a poison control center or emergency room at once. NOTE: This medicine is only for you. Do not share this medicine with others. What if I miss a dose? If you miss a dose, take it as soon as you can. If it is almost time for your next dose, take only that dose. Do not take double or extra doses. What may interact with this medicine? This medicine may interact with the following medications: -certain medicines for blood pressure, heart disease, irregular heart beat -certain medicines for depression like monoamine oxidase (MAO) inhibitors, fluoxetine, or paroxetine -clonidine -dobutamine -epinephrine -isoproterenol -reserpine This list may not describe all possible interactions. Give your health care provider a list of all the medicines, herbs, non-prescription drugs, or dietary supplements you use. Also tell them if you smoke, drink alcohol, or use illegal drugs. Some items may interact with your medicine. What should I watch for while using this medicine? Visit your doctor or health care professional for regular check ups. Contact your doctor right away if your symptoms worsen. Check your blood pressure and pulse rate regularly. Ask your health care professional what your blood pressure and pulse rate should be, and when you should contact them. You may get drowsy or dizzy. Do not drive, use machinery, or do anything that needs mental alertness until you know how this medicine affects you. Do not sit or stand up quickly, especially if you are an older patient. This reduces the risk of dizzy or fainting spells. Contact your doctor if these symptoms  continue. Alcohol may interfere with the effect of this medicine. Avoid alcoholic drinks. What side effects may I notice from receiving this medicine? Side  effects that you should report to your doctor or health care professional as soon as possible: -allergic reactions like skin rash, itching or hives -cold or numb hands or feet -depression -difficulty breathing -faint -fever with sore throat -irregular heartbeat, chest pain -rapid weight gain -swollen legs or ankles Side effects that usually do not require medical attention (report to your doctor or health care professional if they continue or are bothersome): -anxiety or nervousness -change in sex drive or performance -dry skin -headache -nightmares or trouble sleeping -short term memory loss -stomach upset or diarrhea -unusually tired This list may not describe all possible side effects. Call your doctor for medical advice about side effects. You may report side effects to FDA at 1-800-FDA-1088. Where should I keep my medicine? Keep out of the reach of children. Store at room temperature between 15 and 30 degrees C (59 and 86 degrees F). Throw away any unused medicine after the expiration date. NOTE: This sheet is a summary. It may not cover all possible information. If you have questions about this medicine, talk to your doctor, pharmacist, or health care provider.  2018 Elsevier/Gold Standard (2013-08-17 14:40:36)

## 2018-01-02 DIAGNOSIS — Z0000 Encounter for general adult medical examination without abnormal findings: Secondary | ICD-10-CM | POA: Diagnosis not present

## 2018-01-02 DIAGNOSIS — Z136 Encounter for screening for cardiovascular disorders: Secondary | ICD-10-CM | POA: Diagnosis not present

## 2018-01-02 DIAGNOSIS — R7309 Other abnormal glucose: Secondary | ICD-10-CM | POA: Diagnosis not present

## 2018-02-01 DIAGNOSIS — M810 Age-related osteoporosis without current pathological fracture: Secondary | ICD-10-CM | POA: Diagnosis not present

## 2018-02-01 DIAGNOSIS — M8588 Other specified disorders of bone density and structure, other site: Secondary | ICD-10-CM | POA: Diagnosis not present

## 2018-03-03 DIAGNOSIS — Z85820 Personal history of malignant melanoma of skin: Secondary | ICD-10-CM | POA: Diagnosis not present

## 2018-03-03 DIAGNOSIS — L570 Actinic keratosis: Secondary | ICD-10-CM | POA: Diagnosis not present

## 2018-03-03 DIAGNOSIS — D225 Melanocytic nevi of trunk: Secondary | ICD-10-CM | POA: Diagnosis not present

## 2018-03-03 DIAGNOSIS — L814 Other melanin hyperpigmentation: Secondary | ICD-10-CM | POA: Diagnosis not present

## 2018-03-13 DIAGNOSIS — J310 Chronic rhinitis: Secondary | ICD-10-CM | POA: Diagnosis not present

## 2018-03-13 DIAGNOSIS — M810 Age-related osteoporosis without current pathological fracture: Secondary | ICD-10-CM | POA: Diagnosis not present

## 2018-03-14 ENCOUNTER — Encounter: Payer: Self-pay | Admitting: Internal Medicine

## 2018-03-22 ENCOUNTER — Encounter (INDEPENDENT_AMBULATORY_CARE_PROVIDER_SITE_OTHER): Payer: Self-pay

## 2018-03-22 ENCOUNTER — Encounter: Payer: Self-pay | Admitting: Internal Medicine

## 2018-03-22 ENCOUNTER — Ambulatory Visit: Payer: 59 | Admitting: Internal Medicine

## 2018-03-22 VITALS — BP 124/72 | HR 80 | Ht 63.0 in | Wt 136.0 lb

## 2018-03-22 DIAGNOSIS — I471 Supraventricular tachycardia: Secondary | ICD-10-CM | POA: Diagnosis not present

## 2018-03-22 DIAGNOSIS — R079 Chest pain, unspecified: Secondary | ICD-10-CM | POA: Diagnosis not present

## 2018-03-22 MED ORDER — OMEPRAZOLE 40 MG PO CPDR
40.0000 mg | DELAYED_RELEASE_CAPSULE | Freq: Every day | ORAL | 3 refills | Status: DC
Start: 2018-03-22 — End: 2021-12-15

## 2018-03-22 NOTE — Patient Instructions (Signed)
Medication Instructions:  Your physician has recommended you make the following change in your medication:  1.  Start taking omeprazole 40 mg one tablet by mouth daily.  Labwork: None ordered.  Testing/Procedures: None ordered.  Follow-Up: Your physician wants you to follow-up in: 6 months with Dr. Lovena Le.   You will receive a reminder letter in the mail two months in advance. If you don't receive a letter, please call our office to schedule the follow-up appointment.  Any Other Special Instructions Will Be Listed Below (If Applicable).  If you need a refill on your cardiac medications before your next appointment, please call your pharmacy.

## 2018-03-22 NOTE — Progress Notes (Signed)
HPI Sharon Potter returns today for followup of her SVT. She is a pleasant 62 yo woman who had SVT and underwent catheter ablation over 18 years ago. She represented 3 months ago with first time recurrence of SVT which was treated in the ED with IV adenosine. In the interim, she has had 4 episodes of palpitations lasting less than 5-10 minutes. Her main complaint today is that she has non-exertional chest pain. She had a stress test 2 years ago which was negative for ischemia. She notes that the episodes last for a few minutes and are not associated with sob.  Allergies  Allergen Reactions  . Penicillins Hives, Swelling and Rash    Has patient had a PCN reaction causing immediate rash, facial/tongue/throat swelling, SOB or lightheadedness with hypotension: Yes Has patient had a PCN reaction causing severe rash involving mucus membranes or skin necrosis: Yes Has patient had a PCN reaction that required hospitalization: Yes Has patient had a PCN reaction occurring within the last 10 years: No If all of the above answers are "NO", then may proceed with Cephalosporin use.    Sharon Potter Peroxide-Benzy Alc] Swelling and Rash    Severe facial swelling, bumps   . Fosamax [Alendronate] Other (See Comments)    HA  . Lopressor [Metoprolol Tartrate] Other (See Comments)    fatigue  . Sulfa Antibiotics Rash    rash     Current Outpatient Medications  Medication Sig Dispense Refill  . fluticasone (FLONASE) 50 MCG/ACT nasal spray Place 1 spray into both nostrils daily as needed for allergies.     . metoprolol tartrate (LOPRESSOR) 50 MG tablet Take 1 tablet (50 mg total) by mouth daily as needed (Do not take more than 1 tablet in 24 hours). 30 tablet 3  . Probiotic Product (PROBIOTIC PO) Take 1 tablet by mouth daily as needed (supplement).    . VENTOLIN HFA 108 (90 BASE) MCG/ACT inhaler Inhale 2 puffs into the lungs every 4 (four) hours as needed. Shortness of breath or wheezing     No  current facility-administered medications for this visit.      Past Medical History:  Diagnosis Date  . Allergy    RAD - PRN ALB MDI   . Anxiety   . Arrhythmia    history of SVT status post ablation in 2000   . Chest pain 11/26/2014  . Chronic headache   . Depression    no meds  . Fibrocystic disease of breast   . Hemorrhoids   . HSV (herpes simplex virus) infection    outbreaks  . IBS (irritable bowel syndrome)    Dr.Robert Vertell Limber in [facility] 2006 colon and EGD normal  CT 2011  . Osteopenia    BMD- 3/13  . Ovarian cyst    left followed by Dr Sela Hilding   . Palpitations 11/26/2014   c/o HR slow and fast, last 2-3 weeks. Had electrical ablation in 2001  . Shoulder pain 11/26/2014  . Skin melanoma ([place])    on back superficial only, Dr Allyson Sabal  . Uterine fibroid    CT scan 1/11    ROS:   All systems reviewed and negative except as noted in the HPI.   Past Surgical History:  Procedure Laterality Date  . ABLATION  2000 OR 2001  . NASAL SEPTUM SURGERY     repair     Family History  Problem Relation Age of Onset  . Cancer Maternal Aunt  breast  . Cancer Maternal Aunt        breast  . Cancer Mother        lung  . Heart Problems Father        MVR and MVP  . Cancer Father        skin  . Cancer Maternal Grandmother        breast  . Pulmonary embolism Paternal Grandfather   . Cancer Daughter        skin     Social History   Socioeconomic History  . Marital status: Married    Spouse name: Not on file  . Number of children: Not on file  . Years of education: Not on file  . Highest education level: Not on file  Occupational History  . Not on file  Social Needs  . Financial resource strain: Not on file  . Food insecurity:    Worry: Not on file    Inability: Not on file  . Transportation needs:    Medical: Not on file    Non-medical: Not on file  Tobacco Use  . Smoking status: Never Smoker  . Smokeless tobacco: Never Used  Substance and Sexual  Activity  . Alcohol use: Yes    Alcohol/week: 0.0 oz    Comment: Heavy in the past. Wine 2 glasses occasionally   . Drug use: Not on file  . Sexual activity: Not on file  Lifestyle  . Physical activity:    Days per week: Not on file    Minutes per session: Not on file  . Stress: Not on file  Relationships  . Social connections:    Talks on phone: Not on file    Gets together: Not on file    Attends religious service: Not on file    Active member of club or organization: Not on file    Attends meetings of clubs or organizations: Not on file    Relationship status: Not on file  . Intimate partner violence:    Fear of current or ex partner: Not on file    Emotionally abused: Not on file    Physically abused: Not on file    Forced sexual activity: Not on file  Other Topics Concern  . Not on file  Social History Narrative   Tobacco use cigarettes: Never smoked    No smoking   No Tobacco Exposure   Alcohol : yes Heavy in the past: wine 2 glasses    Caffeine: yes   No recreational drug use   No diet   No exercise   Occupation : employed work at [employer] , in [employer] in [place],  [place] Status  : Married    Children girls 1   Seat belt use :yes     Ht 5\' 3"  (1.6 m)   Wt 136 lb (61.7 kg)   BMI 24.09 kg/m   Physical Exam:  Well appearing 62 yo woman, NAD HEENT: Unremarkable Neck:  No JVD, no thyromegally Lymphatics:  No adenopathy Back:  No CVA tenderness Lungs:  Clear with no wheezes HEART:  Regular rate rhythm, no murmurs, no rubs, no clicks Abd:  soft, positive bowel sounds, no organomegally, no rebound, no guarding Ext:  2 plus pulses, no edema, no cyanosis, no clubbing Skin:  No rashes no nodules Neuro:  CN II through XII intact, motor grossly intact  EKG - none  Assess/Plan: 1. SVT - her symptoms are reasonably well  controlled. She has as needed metoprolol which she will continue. 2. Chest pain - I suspect esophageal spasm.  She has been given a prescription for omeprazole. 3. COPD - she has no active wheezing on exam. She will continue her bronchodilator therapy.  Mikle Bosworth.D.

## 2018-04-05 ENCOUNTER — Other Ambulatory Visit: Payer: Self-pay | Admitting: Internal Medicine

## 2018-04-05 DIAGNOSIS — R143 Flatulence: Secondary | ICD-10-CM | POA: Diagnosis not present

## 2018-04-05 DIAGNOSIS — K589 Irritable bowel syndrome without diarrhea: Secondary | ICD-10-CM | POA: Diagnosis not present

## 2018-04-05 DIAGNOSIS — Z1231 Encounter for screening mammogram for malignant neoplasm of breast: Secondary | ICD-10-CM

## 2018-05-05 DIAGNOSIS — R143 Flatulence: Secondary | ICD-10-CM | POA: Diagnosis not present

## 2018-05-05 DIAGNOSIS — K219 Gastro-esophageal reflux disease without esophagitis: Secondary | ICD-10-CM | POA: Diagnosis not present

## 2018-05-05 DIAGNOSIS — K589 Irritable bowel syndrome without diarrhea: Secondary | ICD-10-CM | POA: Diagnosis not present

## 2018-05-26 ENCOUNTER — Ambulatory Visit
Admission: RE | Admit: 2018-05-26 | Discharge: 2018-05-26 | Disposition: A | Discharge status: Home / Self Care | Payer: 59 | Source: Ambulatory Visit | Attending: Internal Medicine | Admitting: Internal Medicine

## 2018-05-26 DIAGNOSIS — Z1231 Encounter for screening mammogram for malignant neoplasm of breast: Secondary | ICD-10-CM | POA: Diagnosis not present

## 2018-06-20 DIAGNOSIS — C44319 Basal cell carcinoma of skin of other parts of face: Secondary | ICD-10-CM | POA: Diagnosis not present

## 2018-06-20 DIAGNOSIS — C44311 Basal cell carcinoma of skin of nose: Secondary | ICD-10-CM | POA: Diagnosis not present

## 2018-07-20 DIAGNOSIS — N183 Chronic kidney disease, stage 3 (moderate): Secondary | ICD-10-CM | POA: Diagnosis not present

## 2018-07-20 DIAGNOSIS — I471 Supraventricular tachycardia: Secondary | ICD-10-CM | POA: Diagnosis not present

## 2018-08-31 DIAGNOSIS — C44311 Basal cell carcinoma of skin of nose: Secondary | ICD-10-CM | POA: Diagnosis not present

## 2018-09-05 DIAGNOSIS — L821 Other seborrheic keratosis: Secondary | ICD-10-CM | POA: Diagnosis not present

## 2018-09-05 DIAGNOSIS — D485 Neoplasm of uncertain behavior of skin: Secondary | ICD-10-CM | POA: Diagnosis not present

## 2018-10-17 DIAGNOSIS — Z23 Encounter for immunization: Secondary | ICD-10-CM | POA: Diagnosis not present

## 2018-11-29 DIAGNOSIS — Z01419 Encounter for gynecological examination (general) (routine) without abnormal findings: Secondary | ICD-10-CM | POA: Diagnosis not present

## 2018-11-29 DIAGNOSIS — Z6823 Body mass index (BMI) 23.0-23.9, adult: Secondary | ICD-10-CM | POA: Diagnosis not present

## 2019-01-22 ENCOUNTER — Telehealth: Payer: Self-pay | Admitting: Internal Medicine

## 2019-01-22 MED ORDER — METOPROLOL TARTRATE 50 MG PO TABS: 50.0000 mg | ORAL_TABLET | Freq: Every day | ORAL | 3 refills | Status: DC | PRN

## 2019-01-22 NOTE — Telephone Encounter (Signed)
New message   Pt c/o medication issue:  1. Name of Medication: metoprolol   2. How are you currently taking this medication (dosage and times per day)? 50 mg takes as needed   3. Are you having a reaction (difficulty breathing--STAT)? no  4. What is your medication issue? PT says  her HR went up on Friday HR 177 according to her fit bit, so she took medication on Friday and HR went back down.  Pt is needing a new refill because hers is expired

## 2019-01-22 NOTE — Telephone Encounter (Signed)
Prescription refilled as requested.  Pt overdue for 6 month follow up with Dr. Lovena Le.  Will send to scheduling to call Pt.

## 2019-03-05 DIAGNOSIS — L814 Other melanin hyperpigmentation: Secondary | ICD-10-CM | POA: Diagnosis not present

## 2019-03-05 DIAGNOSIS — L821 Other seborrheic keratosis: Secondary | ICD-10-CM | POA: Diagnosis not present

## 2019-03-05 DIAGNOSIS — D225 Melanocytic nevi of trunk: Secondary | ICD-10-CM | POA: Diagnosis not present

## 2019-03-27 ENCOUNTER — Telehealth: Payer: Self-pay | Admitting: Internal Medicine

## 2019-03-27 NOTE — Telephone Encounter (Signed)
New message   Called patient about visit on 04.02.20 with Dr. Lovena Le. Patient would like phone call from Dr. Lovena Le, she said there is somethings she would like to discuss. Patient does not have access to computer or smart phone. The best number to reach patient is [phone]

## 2019-03-28 NOTE — Telephone Encounter (Signed)
done

## 2019-03-29 ENCOUNTER — Telehealth (INDEPENDENT_AMBULATORY_CARE_PROVIDER_SITE_OTHER): Payer: 59 | Admitting: Internal Medicine

## 2019-03-29 ENCOUNTER — Other Ambulatory Visit: Payer: Self-pay

## 2019-03-29 DIAGNOSIS — R002 Palpitations: Secondary | ICD-10-CM | POA: Diagnosis not present

## 2019-03-29 MED ORDER — METOPROLOL SUCCINATE ER 25 MG PO TB24: 25.0000 mg | ORAL_TABLET | Freq: Every day | ORAL | 3 refills | Status: DC

## 2019-03-29 NOTE — Progress Notes (Signed)
Electrophysiology TeleHealth Note   Due to national recommendations of social distancing due to [place] 19, an audio telehealth visit is felt to be most appropriate for this patient at this time.  See MyChart message from today for the patient's consent to telehealth for [facility]   Date:  03/29/2019   ID:  Sharon Potter, DOB May 24, 1956, MRN [id number]  Location: patient's home  Provider location: [address] [place]  Evaluation Performed: Follow-up visit  PCP:  Wenda Low, MD  Cardiologist:  No primary care provider on file.  Electrophysiologist:  Dr Lovena Le  Chief Complaint:  Palpitations  History of Present Illness:    Sharon Potter is a 63 y.o. female who presents via audio/video conferencing for a telehealth visit today.  She is a pleasant 63 yo woman with ah/o SVT, s/p ablation 19 years ago. In the interim, she had done well until experiencing a recurrent episode of sustained SVT a year ago. Since last being seen in our clinic, the patient reports that she is working regularly but has had some break through palpitations.  Today, she denies symptoms of shortness of breath,  lower extremity edema, dizziness, presyncope, or syncope.  The patient has non-cardiac chest pain. This has not worsened. The patient denies symptoms of fevers, chills, cough, or new SOB worrisome for COVID 19.  Past Medical History:  Diagnosis Date  . Allergy    RAD - PRN ALB MDI   . Anxiety   . Arrhythmia    history of SVT status post ablation in 2000   . Chest pain 11/26/2014  . Chronic headache   . Depression    no meds  . Fibrocystic disease of breast   . Hemorrhoids   . HSV (herpes simplex virus) infection    outbreaks  . IBS (irritable bowel syndrome)    Dr.Robert Vertell Limber in [facility] 2006 colon and EGD normal  CT 2011  . Osteopenia    BMD- 3/13  . Ovarian cyst    left followed by Dr Sela Hilding   . Palpitations 11/26/2014   c/o HR slow and fast, last 2-3 weeks.  Had electrical ablation in 2001  . Shoulder pain 11/26/2014  . Skin melanoma ([place])    on back superficial only, Dr Allyson Sabal  . Uterine fibroid    CT scan 1/11    Past Surgical History:  Procedure Laterality Date  . ABLATION  2000 OR 2001  . NASAL SEPTUM SURGERY     repair    Current Outpatient Medications  Medication Sig Dispense Refill  . fluticasone (FLONASE) 50 MCG/ACT nasal spray Place 1 spray into both nostrils daily as needed for allergies.     . metoprolol tartrate (LOPRESSOR) 50 MG tablet Take 1 tablet (50 mg total) by mouth daily as needed (Do not take more than 1 tablet in 24 hours). Please call to schedule appt with Dr Lovena Le 30 tablet 3  . omeprazole (PRILOSEC) 40 MG capsule Take 1 capsule (40 mg total) by mouth daily. 90 capsule 3  . Probiotic Product (PROBIOTIC PO) Take 1 tablet by mouth daily as needed (supplement).    . VENTOLIN HFA 108 (90 BASE) MCG/ACT inhaler Inhale 2 puffs into the lungs every 4 (four) hours as needed. Shortness of breath or wheezing     No current facility-administered medications for this visit.     Allergies:   Penicillins; Peroxyl [hydrogen peroxide-benzy alc]; Fosamax [alendronate]; Lopressor [metoprolol tartrate]; and Sulfa antibiotics   Social  History:  The patient  reports that she has never smoked. She has never used smokeless tobacco. She reports current alcohol use.   Family History:  The patient's  family history includes Cancer in her daughter, father, maternal aunt, maternal aunt, maternal grandmother, and mother; Heart Problems in her father; Pulmonary embolism in her paternal grandfather.   ROS:  Please see the history of present illness.   All other systems are personally reviewed and negative.    Exam:    Vital Signs:  There were no vitals taken for this visit.  Well appearing, alert and conversant, regular work of breathing,  good skin color Eyes- anicteric, neuro- grossly intact, skin- no apparent rash or lesions or  cyanosis, mouth- oral mucosa is pink   Labs/Other Tests and Data Reviewed:    Recent Labs: No results found for requested labs within last 8760 hours.   Wt Readings from Last 3 Encounters:  03/22/18 136 lb (61.7 kg)  12/21/17 135 lb (61.2 kg)  12/15/17 135 lb (61.2 kg)       ASSESSMENT & PLAN:    1.  Palpitations - the mechanism is unclear. I have recommended she take toprol 25 mg daily. I will see her in 4 months, sooner if she feels bafdly. 2. Chest pain - this remains non-exertional and I suspect musculoskeletal.  3. COVID 19 screen The patient denies symptoms of COVID 19 at this time.  The importance of social distancing was discussed today.  Follow-up:  4 months  Current medicines are reviewed at length with the patient today.   The patient does not have concerns regarding her medicines.  The following changes were made today:  none  Labs/ tests ordered today include: none No orders of the defined types were placed in this encounter.    Patient Risk:  after full review of this patients clinical status, I feel that they are at moderate risk at this time.  Today, I have spent 15 minutes with the patient with telehealth technology discussing her symptoms .    Signed, Cristopher Peru, MD  03/29/2019 11:17 AM     [facility] [address] [place]  [address] [phone] (office) [phone] (fax)

## 2019-10-10 ENCOUNTER — Other Ambulatory Visit: Payer: Self-pay | Admitting: Internal Medicine

## 2019-10-10 DIAGNOSIS — Z1231 Encounter for screening mammogram for malignant neoplasm of breast: Secondary | ICD-10-CM

## 2019-11-29 ENCOUNTER — Ambulatory Visit
Admission: RE | Admit: 2019-11-29 | Discharge: 2019-11-29 | Disposition: A | Discharge status: Home / Self Care | Payer: 59 | Source: Ambulatory Visit | Attending: Internal Medicine | Admitting: Internal Medicine

## 2019-11-29 ENCOUNTER — Other Ambulatory Visit: Payer: Self-pay

## 2019-11-29 DIAGNOSIS — Z1231 Encounter for screening mammogram for malignant neoplasm of breast: Secondary | ICD-10-CM

## 2019-12-05 IMAGING — MG DIGITAL SCREENING BILAT W/ TOMO W/ CAD
8 series · 9 of 24 positions shown · non-contrast
Comparison: Previous exam(s).

CLINICAL DATA: Screening.

EXAM:
DIGITAL SCREENING BILATERAL MAMMOGRAM WITH TOMO AND CAD

[R MLO synth-2D]
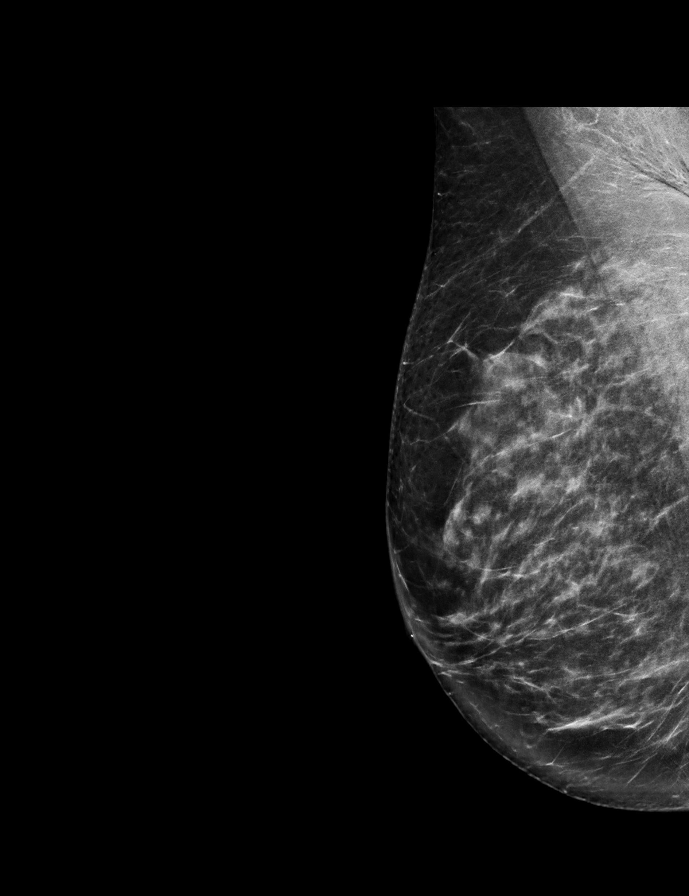

[L MLO synth-2D]
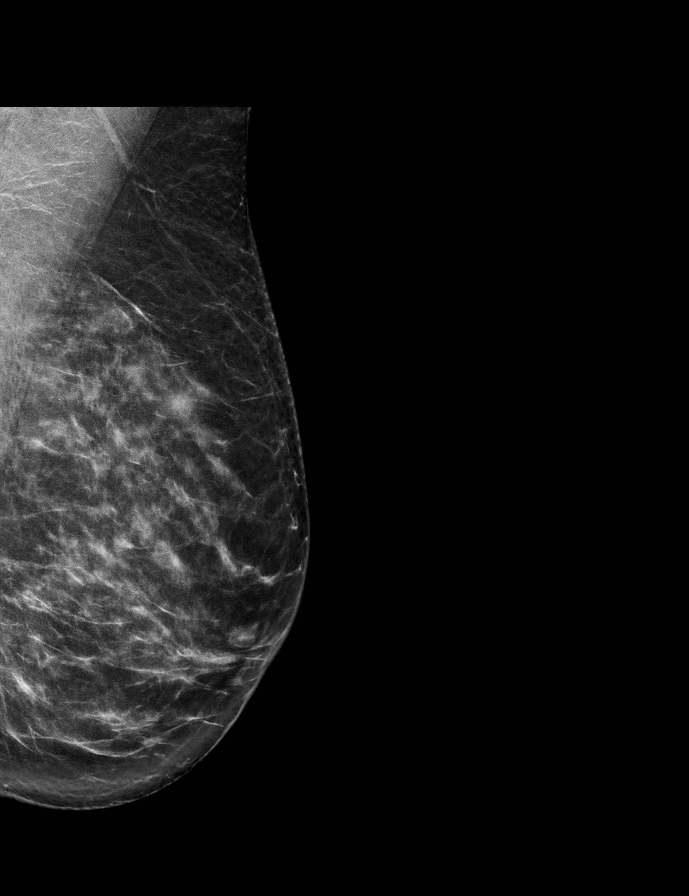

[L CC synth-2D]
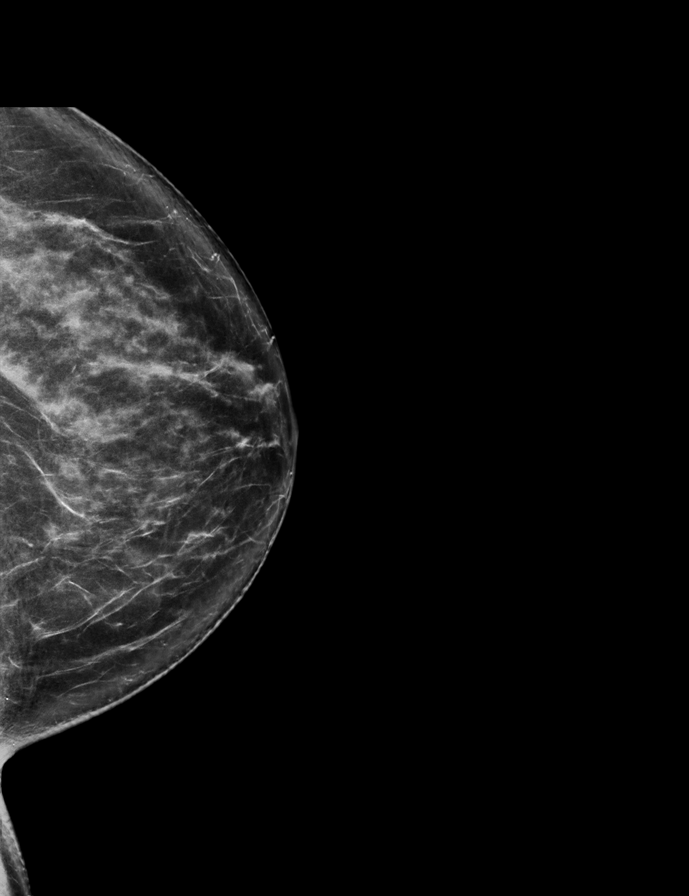

[R CC synth-2D]
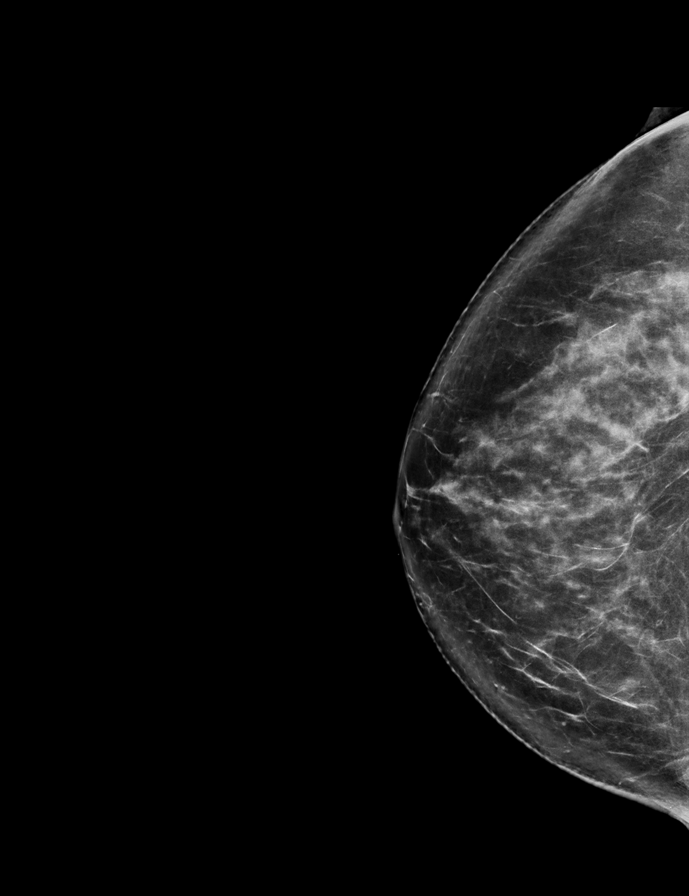

[L CC tomo · 2 of 74 frames shown]
[frame 24/74]
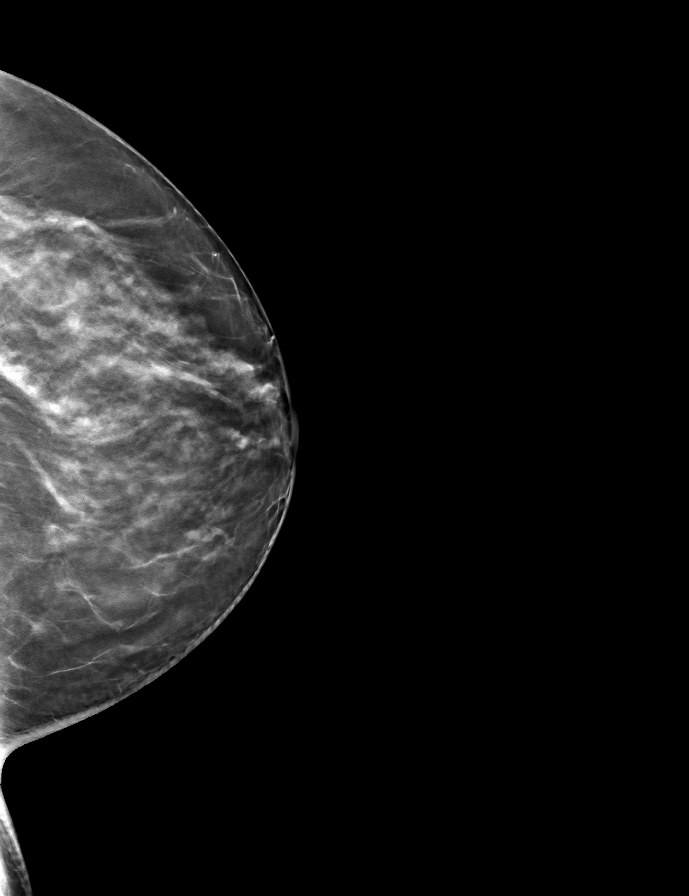
[frame 37/74]
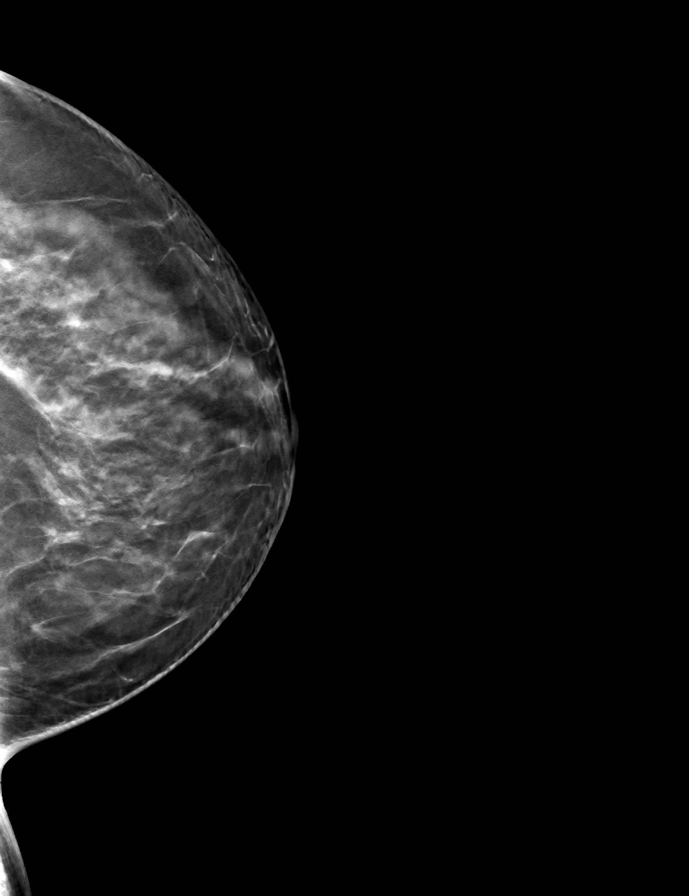

[R MLO tomo · tomo slice 35/70.0]
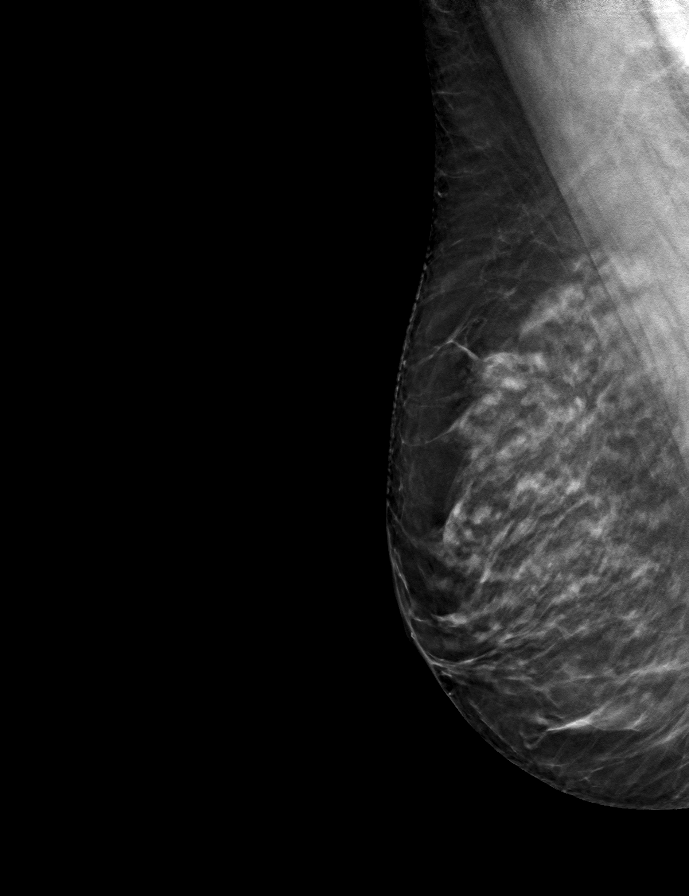

[R CC tomo · tomo slice 39/77.0]
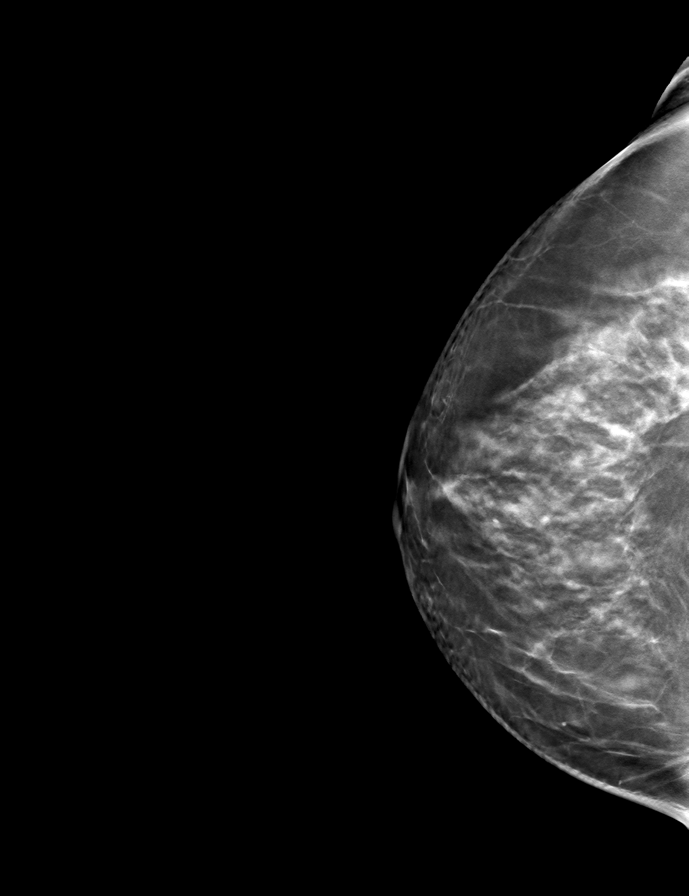

[L MLO tomo · tomo slice 35/70.0]
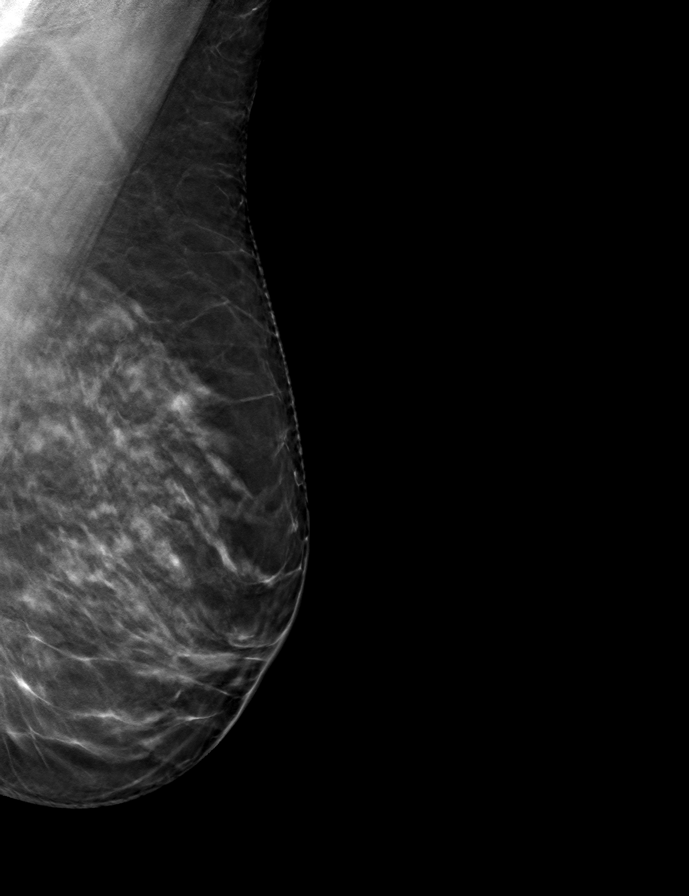

[9 of 24 positions shown; findings below may reference images not displayed]

ACR Breast Density Category c: The breast tissue is heterogeneously
dense, which may obscure small masses.
FINDINGS: There are no findings suspicious for malignancy. Images were
processed with CAD.
IMPRESSION: No mammographic evidence of malignancy. A result letter of this
screening mammogram will be mailed directly to the patient.

RECOMMENDATION:
Screening mammogram in one year. (Code:[id number])

BI-RADS CATEGORY  1: Negative.

## 2020-03-02 ENCOUNTER — Ambulatory Visit: Payer: 59 | Attending: Internal Medicine

## 2020-03-02 DIAGNOSIS — Z23 Encounter for immunization: Secondary | ICD-10-CM | POA: Insufficient documentation

## 2020-03-02 NOTE — Progress Notes (Signed)
   Covid-19 Vaccination Clinic  Name:  Sharon Potter    MRN: [id number] DOB: October 10, 1956  03/02/2020  [name] was observed post Covid-19 immunization for 15 minutes without incident. She was provided with Vaccine Information Sheet and instruction to access the V-Safe system.   [name] was instructed to call 911 with any severe reactions post vaccine: Marland Kitchen Difficulty breathing  . Swelling of face and throat  . A fast heartbeat  . A bad rash all over body  . Dizziness and weakness   Immunizations Administered    Name Date Dose VIS Date Route   Pfizer COVID-19 Vaccine 03/02/2020  4:08 PM 0.3 mL 12/07/2019 Intramuscular   Manufacturer: [employer]   Lot: [id number]   [place]: [id number]

## 2020-03-17 ENCOUNTER — Encounter: Payer: Self-pay | Admitting: Podiatry

## 2020-03-17 ENCOUNTER — Ambulatory Visit: Payer: 59 | Admitting: Podiatry

## 2020-03-17 ENCOUNTER — Other Ambulatory Visit: Payer: Self-pay

## 2020-03-17 VITALS — BP 131/84 | HR 64 | Resp 14

## 2020-03-17 DIAGNOSIS — L603 Nail dystrophy: Secondary | ICD-10-CM

## 2020-03-17 DIAGNOSIS — B351 Tinea unguium: Secondary | ICD-10-CM

## 2020-03-23 NOTE — Progress Notes (Signed)
Subjective:   Patient ID: Sharon Potter, female   DOB: 64 y.o.   MRN: [id number]   HPI 64 year old female presents the office today for concerns of toenail discoloration.  She states that she has.  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  She states that previously the toenail had a black discoloration that did resolve mostly.  Denies any pain of the nails no redness or drainage or any swelling.   Review of Systems  All other systems reviewed and are negative.  Past Medical History:  Diagnosis Date  . Allergy    RAD - PRN ALB MDI   . Anxiety   . Arrhythmia    history of SVT status post ablation in 2000   . Chest pain 11/26/2014  . Chronic headache   . Depression    no meds  . Fibrocystic disease of breast   . Hemorrhoids   . HSV (herpes simplex virus) infection    outbreaks  . IBS (irritable bowel syndrome)    Dr.Robert Vertell Limber in [facility] 2006 colon and EGD normal  CT 2011  . Osteopenia    BMD- 3/13  . Ovarian cyst    left followed by Dr Sela Hilding   . Palpitations 11/26/2014   c/o HR slow and fast, last 2-3 weeks. Had electrical ablation in 2001  . Shoulder pain 11/26/2014  . Skin melanoma ([place])    on back superficial only, Dr Allyson Sabal  . Uterine fibroid    CT scan 1/11    Past Surgical History:  Procedure Laterality Date  . ABLATION  2000 OR 2001  . NASAL SEPTUM SURGERY     repair     Current Outpatient Medications:  .  fluticasone (FLONASE) 50 MCG/ACT nasal spray, Place 1 spray into both nostrils daily as needed for allergies. , Disp: , Rfl:  .  metoprolol succinate (TOPROL XL) 25 MG 24 hr tablet, Take 1 tablet (25 mg total) by mouth daily., Disp: 90 tablet, Rfl: 3 .  metoprolol tartrate (LOPRESSOR) 50 MG tablet, Take 1 tablet (50 mg total) by mouth daily as needed (Do not take more than 1 tablet in 24 hours). Please call to schedule appt with Dr Lovena Le, Disp:  30 tablet, Rfl: 3 .  nystatin-triamcinolone (MYCOLOG II) cream, nystatin-triamcinolone 100,000 unit/g-0.1 % topical cream  APPLY TO THE AFFECTED AREA(S) BY TOPICAL ROUTE 2 TIMES PER DAY IN [facility] AND EVENING, Disp: , Rfl:  .  omeprazole (PRILOSEC) 40 MG capsule, Take 1 capsule (40 mg total) by mouth daily., Disp: 90 capsule, Rfl: 3 .  Probiotic Product (PROBIOTIC PO), Take 1 tablet by mouth daily as needed (supplement)., Disp: , Rfl:  .  terbinafine (LAMISIL) 250 MG tablet, Take 250 mg by mouth daily., Disp: , Rfl:  .  VENTOLIN HFA 108 (90 BASE) MCG/ACT inhaler, Inhale 2 puffs into the lungs every 4 (four) hours as needed. Shortness of breath or wheezing, Disp: , Rfl:   Allergies  Allergen Reactions  . Penicillins Hives, Swelling and Rash    Has patient had a PCN reaction causing immediate rash, facial/tongue/throat swelling, SOB or lightheadedness with hypotension: Yes Has patient had a PCN reaction causing severe rash involving mucus membranes or skin necrosis: Yes Has patient had a PCN reaction that required hospitalization: Yes Has patient had a PCN reaction occurring within the last 10 years: No If all of the above  answers are "NO", then may proceed with Cephalosporin use.    Charmaine Downs Peroxide-Benzy Alc] Swelling and Rash    Severe facial swelling, bumps   . Benzoyl Peroxide   . Fosamax [Alendronate] Other (See Comments)    HA  . Lopressor [Metoprolol Tartrate] Other (See Comments)    fatigue  . Sulfa Antibiotics Rash    rash          Objective:  Physical Exam  General: AAO x3, NAD  Dermatological: Left hallux toenail and left third toenail is the most problematic.  Appear to be hypertrophic, dystrophic, yellow to white discoloration.  On the right hallux, the.  Distal quarter is a small mount of dark discoloration but upon debridement this was able to be removed and there is no extension of hyperpigmentation surrounding skin.        Vascular: Dorsalis  Pedis artery and Posterior Tibial artery pedal pulses are 2/4 bilateral with immedate capillary fill time. Pedal hair growth present. No varicosities and no lower extremity edema present bilateral. There is no pain with calf compression, swelling, warmth, erythema.   Neruologic: Grossly intact via light touch bilateral.   Musculoskeletal: No gross boney pedal deformities bilateral. No pain, crepitus, or limitation noted with foot and ankle range of motion bilateral. Muscular strength 5/5 in all groups tested bilateral.  Gait: Unassisted, Nonantalgic.       Assessment:   Onychodystrophy, likely onychomycosis     Plan:  -Treatment options discussed including all alternatives, risks, and complications -Etiology of symptoms were discussed -As she is been on numerous antifungal treatments without any significant resolution I did debride the nails today as of this for culture, pathology to [facility] labs.  Discussed further treatment options but where there is also the culture before proceeding with more definitive treatment.  She agrees to this plan has no further questions.  Trula Slade DPM

## 2020-04-02 ENCOUNTER — Ambulatory Visit: Payer: 59 | Attending: Internal Medicine

## 2020-04-02 DIAGNOSIS — Z23 Encounter for immunization: Secondary | ICD-10-CM

## 2020-04-02 NOTE — Progress Notes (Signed)
   Covid-19 Vaccination Clinic  Name:  Sharon Potter    MRN: [id number] DOB: Feb 07, 1956  04/02/2020  Sharon Potter was observed post Covid-19 immunization for 15 minutes without incident. She was provided with Vaccine Information Sheet and instruction to access the V-Safe system.   Sharon Potter was instructed to call 911 with any severe reactions post vaccine: Marland Kitchen Difficulty breathing  . Swelling of face and throat  . A fast heartbeat  . A bad rash all over body  . Dizziness and weakness   Immunizations Administered    Name Date Dose VIS Date Route   Pfizer COVID-19 Vaccine 04/02/2020  3:59 PM 0.3 mL 12/07/2019 Intramuscular   Manufacturer: [employer]   Lot: [id number]   [place]: [id number]

## 2020-04-09 ENCOUNTER — Telehealth: Payer: Self-pay | Admitting: *Deleted

## 2020-04-09 DIAGNOSIS — B351 Tinea unguium: Secondary | ICD-10-CM

## 2020-04-09 DIAGNOSIS — Z79899 Other long term (current) drug therapy: Secondary | ICD-10-CM

## 2020-04-09 NOTE — Telephone Encounter (Signed)
Left message for pt to call for results  

## 2020-04-09 NOTE — Telephone Encounter (Signed)
-----   Message from Trula Slade, DPM sent at 04/08/2020  4:53 PM EDT ----- I reviewed her nail culture. It did show fungus. She had previously done a short course of lamisil. We could do a 3 month course but would need to check CBC and LFT. She has tried [place]. We could also do a topical through [facility].

## 2020-04-10 MED ORDER — NONFORMULARY OR COMPOUNDED ITEM: 11 refills | Status: DC

## 2020-04-10 NOTE — Telephone Encounter (Signed)
Patient returned your call for her results

## 2020-04-10 NOTE — Telephone Encounter (Signed)
I informed pt of Dr. Leigh Aurora review of results and orders. Pt states she would like to do the lamisl and topical. I informed pt the labs would be sent to [facility] [place]. [place], as walk-in and no fasting require. I informed pt [place] [phone] they would call with insurance coverage and delivery information. Faxed orders to [facility].

## 2020-08-18 ENCOUNTER — Other Ambulatory Visit: Payer: Self-pay | Admitting: Internal Medicine

## 2020-08-18 DIAGNOSIS — Z1231 Encounter for screening mammogram for malignant neoplasm of breast: Secondary | ICD-10-CM

## 2020-08-20 ENCOUNTER — Other Ambulatory Visit: Payer: Self-pay | Admitting: Internal Medicine

## 2020-10-21 ENCOUNTER — Encounter: Payer: Self-pay | Admitting: Internal Medicine

## 2020-10-21 ENCOUNTER — Other Ambulatory Visit: Payer: Self-pay

## 2020-10-21 ENCOUNTER — Ambulatory Visit: Payer: 59 | Admitting: Internal Medicine

## 2020-10-21 VITALS — BP 122/84 | HR 62 | Ht 63.0 in | Wt 130.2 lb

## 2020-10-21 DIAGNOSIS — R002 Palpitations: Secondary | ICD-10-CM

## 2020-10-21 NOTE — Progress Notes (Signed)
HPI Sharon Potter returns today for followup. She is a pleasant 64 yo woman with a remote h/o SVT who underwent catheter ablation over 20 years ago. She has had some palpitations and notes an episode in August when her HR was in the 160 range for several minutes. She did not seek medical attention. She has not had chest pain. She notes the sensation of an irregular hard heart beat at times. She is back taking the toprol and appears that her symptoms are better.  Allergies  Allergen Reactions  . Penicillins Hives, Swelling and Rash    Has patient had a PCN reaction causing immediate rash, facial/tongue/throat swelling, SOB or lightheadedness with hypotension: Yes Has patient had a PCN reaction causing severe rash involving mucus membranes or skin necrosis: Yes Has patient had a PCN reaction that required hospitalization: Yes Has patient had a PCN reaction occurring within the last 10 years: No If all of the above answers are "NO", then may proceed with Cephalosporin use.    Sharon Potter Peroxide-Benzy Alc] Swelling and Rash    Severe facial swelling, bumps   . Benzoyl Peroxide   . Fosamax [Alendronate] Other (See Comments)    HA  . Lopressor [Metoprolol Tartrate] Other (See Comments)    fatigue  . Sulfa Antibiotics Rash    rash     Current Outpatient Medications  Medication Sig Dispense Refill  . fluticasone (FLONASE) 50 MCG/ACT nasal spray Place 1 spray into both nostrils daily as needed for allergies.     . metoprolol succinate (TOPROL-XL) 25 MG 24 hr tablet TAKE 1 TABLET BY MOUTH EVERY DAY 30 tablet 1  . NONFORMULARY OR COMPOUNDED ITEM [facility]:  Antifungal topical - Terbinafine 3%, Fluconazole 2%, Tea Tree Oil 5%, Ibuprofen 2%, in DMSO suspension #62ml. Apply to affected toenail(s) once at bedtime or twice daily. 30 each 11  . nystatin-triamcinolone (MYCOLOG II) cream nystatin-triamcinolone 100,000 unit/g-0.1 % topical cream  APPLY TO THE AFFECTED AREA(S) BY  TOPICAL ROUTE 2 TIMES PER DAY IN [facility] AND EVENING    . omeprazole (PRILOSEC) 40 MG capsule Take 1 capsule (40 mg total) by mouth daily. 90 capsule 3  . Probiotic Product (PROBIOTIC PO) Take 1 tablet by mouth daily as needed (supplement).    . terbinafine (LAMISIL) 250 MG tablet Take 250 mg by mouth daily.    . VENTOLIN HFA 108 (90 BASE) MCG/ACT inhaler Inhale 2 puffs into the lungs every 4 (four) hours as needed. Shortness of breath or wheezing    . metoprolol tartrate (LOPRESSOR) 50 MG tablet Take 1 tablet (50 mg total) by mouth daily as needed (Do not take more than 1 tablet in 24 hours). Please call to schedule appt with Dr Lovena Le 30 tablet 3   No current facility-administered medications for this visit.     Past Medical History:  Diagnosis Date  . Allergy    RAD - PRN ALB MDI   . Anxiety   . Arrhythmia    history of SVT status post ablation in 2000   . Chest pain 11/26/2014  . Chronic headache   . Depression    no meds  . Fibrocystic disease of breast   . Hemorrhoids   . HSV (herpes simplex virus) infection    outbreaks  . IBS (irritable bowel syndrome)    Dr.Robert Vertell Limber in [facility] 2006 colon and EGD normal  CT 2011  . Osteopenia    BMD- 3/13  . Ovarian cyst  left followed by Dr Sela Hilding   . Palpitations 11/26/2014   c/o HR slow and fast, last 2-3 weeks. Had electrical ablation in 2001  . Shoulder pain 11/26/2014  . Skin melanoma ([place])    on back superficial only, Dr Allyson Sabal  . Uterine fibroid    CT scan 1/11    ROS:   All systems reviewed and negative except as noted in the HPI.   Past Surgical History:  Procedure Laterality Date  . ABLATION  2000 OR 2001  . NASAL SEPTUM SURGERY     repair     Family History  Problem Relation Age of Onset  . Cancer Maternal Aunt        breast  . Cancer Maternal Aunt        breast  . Cancer Mother        lung  . Heart Problems Father        MVR and MVP  . Cancer Father        skin  . Cancer Maternal  Grandmother        breast  . Pulmonary embolism Paternal Grandfather   . Cancer Daughter        skin  . Breast cancer Neg Hx      Social History   Socioeconomic History  . Marital status: Married    Spouse name: Not on file  . Number of children: Not on file  . Years of education: Not on file  . Highest education level: Not on file  Occupational History  . Not on file  Tobacco Use  . Smoking status: Never Smoker  . Smokeless tobacco: Never Used  Vaping Use  . Vaping Use: Never used  Substance and Sexual Activity  . Alcohol use: Yes    Alcohol/week: 0.0 standard drinks    Comment: Heavy in the past. Wine 2 glasses occasionally   . Drug use: Not on file  . Sexual activity: Not on file  Other Topics Concern  . Not on file  Social History Narrative   Tobacco use cigarettes: Never smoked    No smoking   No Tobacco Exposure   Alcohol : yes Heavy in the past: wine 2 glasses    Caffeine: yes   No recreational drug use   No diet   No exercise   Occupation : employed work at [employer] , in [employer] in [place],  [place] Status  : Married    Children girls 1   Seat belt use :yes   Social Determinants of Radio broadcast assistant Strain:   . Difficulty of Paying Living Expenses: Not on file  Food Insecurity:   . Worried About Charity fundraiser in the Last Year: Not on file  . Ran Out of Food in the Last Year: Not on file  Transportation Needs:   . Lack of Transportation (Medical): Not on file  . Lack of Transportation (Non-Medical): Not on file  Physical Activity:   . Days of Exercise per Week: Not on file  . Minutes of Exercise per Session: Not on file  Stress:   . Feeling of Stress : Not on file  Social Connections:   . Frequency of Communication with Friends and Family: Not on file  . Frequency of Social Gatherings with Friends and Family: Not on file  . Attends Religious Services: Not on file  . Active Member of Clubs or  Organizations: Not on file  . Attends  Club or Organization Meetings: Not on file  . Marital Status: Not on file  Intimate Partner Violence:   . Fear of Current or Ex-Partner: Not on file  . Emotionally Abused: Not on file  . Physically Abused: Not on file  . Sexually Abused: Not on file     BP 122/84   Pulse 62   Ht 5\' 3"  (1.6 m)   Wt 130 lb 3.2 oz (59.1 kg)   SpO2 98%   BMI 23.06 kg/m   Physical Exam:  Well appearing NAD HEENT: Unremarkable Neck:  No JVD, no thyromegally Lymphatics:  No adenopathy Back:  No CVA tenderness Lungs:  Clear with no wheezes HEART:  Regular rate rhythm, no murmurs, no rubs, no clicks Abd:  soft, positive bowel sounds, no organomegally, no rebound, no guarding Ext:  2 plus pulses, no edema, no cyanosis, no clubbing Skin:  No rashes no nodules Neuro:  CN II through XII intact, motor grossly intact  EKG - nsr  Assess/Plan: 1. Palpitations - the etiology is unclear. She appears to be better. We discussed the diagnostic possibilities. She will undergo watchful waiting and continue taking her toprol. 2. SVT - she is s/p remote ablation. I suspect that it was AVNRT. Minimal recurrent symptoms.  Sharon Potter Sharon Walczyk,MD

## 2020-10-21 NOTE — Patient Instructions (Addendum)

## 2020-12-01 ENCOUNTER — Other Ambulatory Visit: Payer: Self-pay

## 2020-12-01 ENCOUNTER — Ambulatory Visit
Admission: RE | Admit: 2020-12-01 | Discharge: 2020-12-01 | Disposition: A | Discharge status: Home / Self Care | Payer: 59 | Source: Ambulatory Visit | Attending: Internal Medicine | Admitting: Internal Medicine

## 2020-12-01 DIAGNOSIS — Z1231 Encounter for screening mammogram for malignant neoplasm of breast: Secondary | ICD-10-CM

## 2020-12-02 ENCOUNTER — Other Ambulatory Visit: Payer: Self-pay | Admitting: Internal Medicine

## 2020-12-02 DIAGNOSIS — I471 Supraventricular tachycardia: Secondary | ICD-10-CM

## 2021-01-07 ENCOUNTER — Encounter: Payer: Self-pay | Admitting: Pulmonary Disease

## 2021-01-07 ENCOUNTER — Other Ambulatory Visit: Payer: Self-pay

## 2021-01-07 ENCOUNTER — Ambulatory Visit (INDEPENDENT_AMBULATORY_CARE_PROVIDER_SITE_OTHER): Payer: Medicare Other | Admitting: Pulmonary Disease

## 2021-01-07 VITALS — BP 114/70 | HR 59 | Temp 98.0°F | Ht 63.0 in | Wt 132.6 lb

## 2021-01-07 DIAGNOSIS — R059 Cough, unspecified: Secondary | ICD-10-CM

## 2021-01-07 DIAGNOSIS — J4520 Mild intermittent asthma, uncomplicated: Secondary | ICD-10-CM

## 2021-01-07 DIAGNOSIS — K219 Gastro-esophageal reflux disease without esophagitis: Secondary | ICD-10-CM

## 2021-01-07 NOTE — Progress Notes (Signed)
Synopsis: Referred in 12/2020 for chronic cough by Daiva Eves, MD  Subjective:   PATIENT ID: Sharon Potter GENDER: female DOB: Nov 25, 1956, MRN: [id number]   HPI  Chief Complaint  Patient presents with  . Consult    Referred by PCP for chronic cough for the past 5 months. Started out as a productive cough but has switched over to non-productive cough.     Sharon Potter is a 65 year old woman, never smoker with who is referred to pulmonary clinic for chronic cough.   She has had a cough since August 2021 where she was also experiencing shortness of breath and wheezing. The cough was productive at that time and has since become dry. She was treated initially with antibiotics and steroids with some improvement but the cough persisted and she has subsequently been treated with other rounds of antibiotics and prednisone with out resolution of the cough. She does experience chest pressure along with the cough. She denies any limitations to her daily functioning from dyspnea. She does report chest pressure along with the cough at times. Albuterol has provided some relief of the cough when used. She denies any sinus congestion or drainage. She has been prescribed PPI therapy for GERD but has not started these medications. She has elevated the head of her bed over the past year without improvement in the cough. The cough does wake her up about once a week. She reports having a hoarse voice after talking for a while.   She grew up around second hand smoke as her mother was a smoker and had lung cancer.    Past Medical History:  Diagnosis Date  . Allergy    RAD - PRN ALB MDI   . Anxiety   . Arrhythmia    history of SVT status post ablation in 2000   . Chest pain 11/26/2014  . Chronic headache   . Depression    no meds  . Fibrocystic disease of breast   . Hemorrhoids   . HSV (herpes simplex virus) infection    outbreaks  . IBS (irritable bowel syndrome)    Dr.Robert Vertell Limber in [facility] 2006 colon and EGD normal  CT 2011  . Osteopenia    BMD- 3/13  . Ovarian cyst    left followed by Dr Sela Hilding   . Palpitations 11/26/2014   c/o HR slow and fast, last 2-3 weeks. Had electrical ablation in 2001  . Shoulder pain 11/26/2014  . Skin melanoma ([place])    on back superficial only, Dr Allyson Sabal  . Uterine fibroid    CT scan 1/11     Family History  Problem Relation Age of Onset  . Cancer Maternal Aunt        breast  . Cancer Maternal Aunt        breast  . Cancer Mother        lung  . Heart Problems Father        MVR and MVP  . Cancer Father        skin  . Cancer Maternal Grandmother        breast  . Pulmonary embolism Paternal Grandfather   . Cancer Daughter        skin  . Breast cancer Neg Hx      Social History   Socioeconomic History  . Marital status: Married    Spouse name: Not on file  . Number of children: Not on file  . Years of education: Not  on file  . Highest education level: Not on file  Occupational History  . Not on file  Tobacco Use  . Smoking status: Never Smoker  . Smokeless tobacco: Never Used  Vaping Use  . Vaping Use: Never used  Substance and Sexual Activity  . Alcohol use: Yes    Alcohol/week: 0.0 standard drinks    Comment: Heavy in the past. Wine 2 glasses occasionally   . Drug use: Not on file  . Sexual activity: Not on file  Other Topics Concern  . Not on file  Social History Narrative   Tobacco use cigarettes: Never smoked    No smoking   No Tobacco Exposure   Alcohol : yes Heavy in the past: wine 2 glasses    Caffeine: yes   No recreational drug use   No diet   No exercise   Occupation : employed work at [employer] , in [employer] in [place],  [place] Status  : Married    Children girls 1   Seat belt use :yes   Social Determinants of Radio broadcast assistant Strain: Not on file  Food Insecurity: Not on file  Transportation Needs: Not on file  Physical Activity: Not on file   Stress: Not on file  Social Connections: Not on file  Intimate Partner Violence: Not on file     Allergies  Allergen Reactions  . Penicillins Hives, Swelling and Rash    Has patient had a PCN reaction causing immediate rash, facial/tongue/throat swelling, SOB or lightheadedness with hypotension: Yes Has patient had a PCN reaction causing severe rash involving mucus membranes or skin necrosis: Yes Has patient had a PCN reaction that required hospitalization: Yes Has patient had a PCN reaction occurring within the last 10 years: No If all of the above answers are "NO", then may proceed with Cephalosporin use.    Sharon Potter Peroxide-Benzy Alc] Swelling and Rash    Severe facial swelling, bumps   . Benzoyl Peroxide   . Fosamax [Alendronate] Other (See Comments)    HA  . Levaquin [Levofloxacin] Other (See Comments)    Caused her tendons in her legs to cramp  . Lopressor [Metoprolol Tartrate] Other (See Comments)    fatigue  . Sulfa Antibiotics Rash    rash     Outpatient Medications Prior to Visit  Medication Sig Dispense Refill  . escitalopram (LEXAPRO) 10 MG tablet Take 10 mg by mouth daily.    . fluticasone (FLONASE) 50 MCG/ACT nasal spray Place 1 spray into both nostrils daily as needed for allergies.     . metoprolol succinate (TOPROL-XL) 25 MG 24 hr tablet TAKE 1 TABLET BY MOUTH EVERY DAY 30 tablet 1  . NONFORMULARY OR COMPOUNDED ITEM [facility]:  Antifungal topical - Terbinafine 3%, Fluconazole 2%, Tea Tree Oil 5%, Ibuprofen 2%, in DMSO suspension #50ml. Apply to affected toenail(s) once at bedtime or twice daily. 30 each 11  . nystatin-triamcinolone (MYCOLOG II) cream nystatin-triamcinolone 100,000 unit/g-0.1 % topical cream  APPLY TO THE AFFECTED AREA(S) BY TOPICAL ROUTE 2 TIMES PER DAY IN [facility] AND EVENING    . omeprazole (PRILOSEC) 40 MG capsule Take 1 capsule (40 mg total) by mouth daily. 90 capsule 3  . Probiotic Product (PROBIOTIC PO) Take 1  tablet by mouth daily as needed (supplement).    . terbinafine (LAMISIL) 250 MG tablet Take 250 mg by mouth daily.    . VENTOLIN HFA 108 (90 BASE) MCG/ACT inhaler  Inhale 2 puffs into the lungs every 4 (four) hours as needed. Shortness of breath or wheezing    . metoprolol tartrate (LOPRESSOR) 50 MG tablet Take 1 tablet (50 mg total) by mouth daily as needed (Do not take more than 1 tablet in 24 hours). Please call to schedule appt with Dr Ladona Ridgel 30 tablet 3   No facility-administered medications prior to visit.    Review of Systems  Constitutional: Negative for chills, diaphoresis, fever, malaise/fatigue and weight loss.  HENT: Negative for congestion, sinus pain and sore throat.   Eyes: Negative.   Respiratory: Positive for cough and sputum production. Negative for hemoptysis, shortness of breath and wheezing.   Cardiovascular: Negative for chest pain, palpitations, orthopnea, claudication, leg swelling and PND.  Gastrointestinal: Positive for heartburn. Negative for abdominal pain, diarrhea, nausea and vomiting.  Genitourinary: Negative.   Musculoskeletal: Negative.   Neurological: Negative for dizziness, weakness and headaches.  Endo/Heme/Allergies: Negative.   Psychiatric/Behavioral: Negative.    Objective:   Vitals:   01/07/21 1333  BP: 114/70  Pulse: (!) 59  Temp: 98 F (36.7 C)  TempSrc: Temporal  SpO2: 99%  Weight: 132 lb 9.6 oz (60.1 kg)  Height: 5\' 3"  (1.6 m)     Physical Exam Constitutional:      General: She is not in acute distress.    Appearance: Normal appearance. She is normal weight. She is not ill-appearing.  HENT:     Head: Normocephalic and atraumatic.     Nose: Nose normal.     Mouth/Throat:     Mouth: Mucous membranes are moist.     Pharynx: Oropharynx is clear.  Eyes:     General: No scleral icterus.    Conjunctiva/sclera: Conjunctivae normal.     Pupils: Pupils are equal, round, and reactive to light.  Cardiovascular:     Rate and Rhythm:  Normal rate and regular rhythm.     Pulses: Normal pulses.     Heart sounds: Normal heart sounds. No murmur heard.   Pulmonary:     Effort: Pulmonary effort is normal.     Breath sounds: Decreased air movement present. No wheezing, rhonchi or rales.  Musculoskeletal:     Right lower leg: No edema.     Left lower leg: No edema.  Skin:    General: Skin is warm and dry.  Neurological:     General: No focal deficit present.     Mental Status: She is alert.  Psychiatric:        Mood and Affect: Mood normal.        Behavior: Behavior normal.        Thought Content: Thought content normal.        Judgment: Judgment normal.    CBC    Component Value Date/Time   WBC 8.5 12/15/2017 1341   RBC 4.70 12/15/2017 1341   HGB 14.4 12/15/2017 1341   HCT 42.2 12/15/2017 1341   PLT 225 12/15/2017 1341   MCV 89.8 12/15/2017 1341   MCH 30.6 12/15/2017 1341   MCHC 34.1 12/15/2017 1341   RDW 12.2 12/15/2017 1341    Chest imaging: CXR 09/30/20 The cardiomediastinal contours are normal. The lungs are clear. Pulmonary vasculature is normal. No consolidation, pleural effusion, or pneumothorax. No acute osseous abnormalities are seen.  CXR 2018 Defibrillator pads are in place. Lungs are clear. Heart size is normal. No pneumothorax or pleural effusion. No acute bony abnormality.  CT Chest 2014 Stable small ground-glass opacities in the right lower  lobe. Since  these have been followed at 1 year, one additional follow-up CT  Chest in 3 years is recommended to exclude slow growing  Adenocarcinoma.  PFT: No flowsheet data found.    Assessment & Plan:   Cough  Mild intermittent reactive airway disease without complication  Gastroesophageal reflux disease without esophagitis  Discussion: Marcelina Toll is a 65 year old woman, never smoker who is referred to pulmonary clinic for chronic cough.   She has chronic dry cough with occasional sputum production that is clear/whitish. Her chest  radiographs do not show any overt nodules, consolidations or infiltrates. There is some concern for possible hyperinflation given mildly increased retrosternal airspace on lateral view along with increased spaces between ribs on the AP view. She grew up in a household with second hand smoke. So given the cough, possible hyperinflation on chest imaging along with second hand smoke exposure she is at risk for asthma.   We have provided her with sample Breo ellipta inhaler to trial for 2 weeks. If she notices improvement in her cough then we will send in a prescription. She can continue to use as needed albuterol.   She also has possible underlying reflux disease. She has been instructed to start either pantoprazole or omeprazole which has been prescribed by her other providers in the past. She does drink alcohol throughout the week which increases her risk for reflux disease.   Follow up in 2 months. If she benefits from inhaler therapy we will consider pulmonary function testing in the future.  Freda Jackson, MD [facility] Office: [phone]   See Amion for Pager Details      Current Outpatient Medications:  .  escitalopram (LEXAPRO) 10 MG tablet, Take 10 mg by mouth daily., Disp: , Rfl:  .  fluticasone (FLONASE) 50 MCG/ACT nasal spray, Place 1 spray into both nostrils daily as needed for allergies. , Disp: , Rfl:  .  metoprolol succinate (TOPROL-XL) 25 MG 24 hr tablet, TAKE 1 TABLET BY MOUTH EVERY DAY, Disp: 30 tablet, Rfl: 1 .  NONFORMULARY OR COMPOUNDED ITEM, [facility]:  Antifungal topical - Terbinafine 3%, Fluconazole 2%, Tea Tree Oil 5%, Ibuprofen 2%, in DMSO suspension #61ml. Apply to affected toenail(s) once at bedtime or twice daily., Disp: 30 each, Rfl: 11 .  nystatin-triamcinolone (MYCOLOG II) cream, nystatin-triamcinolone 100,000 unit/g-0.1 % topical cream  APPLY TO THE AFFECTED AREA(S) BY TOPICAL ROUTE 2 TIMES PER DAY IN [facility] AND EVENING,  Disp: , Rfl:  .  omeprazole (PRILOSEC) 40 MG capsule, Take 1 capsule (40 mg total) by mouth daily., Disp: 90 capsule, Rfl: 3 .  Probiotic Product (PROBIOTIC PO), Take 1 tablet by mouth daily as needed (supplement)., Disp: , Rfl:  .  terbinafine (LAMISIL) 250 MG tablet, Take 250 mg by mouth daily., Disp: , Rfl:  .  VENTOLIN HFA 108 (90 BASE) MCG/ACT inhaler, Inhale 2 puffs into the lungs every 4 (four) hours as needed. Shortness of breath or wheezing, Disp: , Rfl:  .  metoprolol tartrate (LOPRESSOR) 50 MG tablet, Take 1 tablet (50 mg total) by mouth daily as needed (Do not take more than 1 tablet in 24 hours). Please call to schedule appt with Dr Lovena Le, Disp: 30 tablet, Rfl: 3

## 2021-01-07 NOTE — Patient Instructions (Addendum)
Start either pantoprazole or omeprazole prescribed by your other doctors.  Start breo ellipta 200-67mcg one inhalation daily (rinse mouth out after use) - if you notice improvement in the cough, please let us know and we will send in prescription for you to continue

## 2021-01-14 ENCOUNTER — Ambulatory Visit
Admission: RE | Admit: 2021-01-14 | Discharge: 2021-01-14 | Disposition: A | Discharge status: Home / Self Care | Payer: Medicare Other | Source: Ambulatory Visit | Attending: Internal Medicine | Admitting: Internal Medicine

## 2021-01-14 ENCOUNTER — Other Ambulatory Visit: Payer: Self-pay

## 2021-01-14 DIAGNOSIS — Z1231 Encounter for screening mammogram for malignant neoplasm of breast: Secondary | ICD-10-CM | POA: Diagnosis not present

## 2021-01-19 ENCOUNTER — Telehealth: Payer: Self-pay | Admitting: Pulmonary Disease

## 2021-01-19 NOTE — Telephone Encounter (Signed)
Spoke with patient. She stated that since she has started the Breo 200, she has noticed that she has been more hoarse. Before starting the inhaler, she was already having problems with hoarseness but the Memory Dance has seemed to make it worse. She denied any increase SOB, throat swelling or other symptoms. So far she has not noticed a difference in her cough.   She wanted to know if Dr. Erin Fulling had any recommendations for her.   Dr. Erin Fulling, can you please advise? Thanks!

## 2021-01-19 NOTE — Telephone Encounter (Signed)
I spoke with patient. She has noticed some improvement in cough but has increasing hoarseness of voice. She has not noticed improvement in her breathing since starting breo. She has not started her PPI therapy.   The increase in voice hoarseness is likely due to the steroid component of breo. I asked her to stop this medication since she has not noticed much benefit from hit. She can continue as needed albuterol.   She has been asked to start her PPI therapy as discussed before. To elevate the head of her bed about 30 degrees since she has adjustable base bed. We also discussed cutting back on her wine in the evening times which can lead to increased reflux symptoms.   If the hoarseness of her voice does not improve with these changes, then she should see ENT for further evaluation.   Nothing further needed.   Thanks, Wille Glaser

## 2021-01-26 ENCOUNTER — Other Ambulatory Visit: Payer: Self-pay | Admitting: Internal Medicine

## 2021-01-26 DIAGNOSIS — I471 Supraventricular tachycardia: Secondary | ICD-10-CM

## 2021-02-16 ENCOUNTER — Encounter: Payer: Self-pay | Admitting: Pulmonary Disease

## 2021-02-16 ENCOUNTER — Other Ambulatory Visit: Payer: Self-pay

## 2021-02-16 ENCOUNTER — Ambulatory Visit: Payer: Medicare Other | Admitting: Pulmonary Disease

## 2021-02-16 VITALS — BP 118/72 | HR 59 | Temp 98.7°F | Ht 62.5 in | Wt 131.8 lb

## 2021-02-16 DIAGNOSIS — R059 Cough, unspecified: Secondary | ICD-10-CM | POA: Diagnosis not present

## 2021-02-16 DIAGNOSIS — K219 Gastro-esophageal reflux disease without esophagitis: Secondary | ICD-10-CM

## 2021-02-16 DIAGNOSIS — R490 Dysphonia: Secondary | ICD-10-CM

## 2021-02-16 DIAGNOSIS — J4520 Mild intermittent asthma, uncomplicated: Secondary | ICD-10-CM | POA: Diagnosis not present

## 2021-02-16 DIAGNOSIS — R0602 Shortness of breath: Secondary | ICD-10-CM

## 2021-02-16 MED ORDER — ANORO ELLIPTA 62.5-25 MCG/INH IN AEPB: 1.0000 | INHALATION_SPRAY | Freq: Every day | RESPIRATORY_TRACT | 3 refills | Status: DC

## 2021-02-16 NOTE — Progress Notes (Signed)
Synopsis: Referred in 12/2020 for chronic cough by Daiva Eves, MD  Subjective:   PATIENT ID: Sharon Potter GENDER: female DOB: Sep 20, 1956, MRN: [id number]  Day to day - one day can talk, next day will having coughing fits.  Wheezing  HPI  Chief Complaint  Patient presents with  . Follow-up    Cough has not improved since last visit per daughter   Sharon Potter is a 65 year old woman, never smoker who returns to pulmonary clinic for chronic cough.   She continues to have persistent cough with cough spells along with intermittent wheezing and shortness of breath. She did not tolerate the breo ellipta as this made her voice increasingly hoarse, so this was stopped. She reports it did help with her breathing a bit. She did start omeprazole 40mg  daily with some improvement in her cough. She is sleeping with the head of the bed elevated as well. She has cut back on her evening wine.  She is accompanied by her daughter. They are concerned that the cough remains an issue and that it has been ongoing for many months now with no significant improvement.    OV 01/07/21 She has had a cough since August 2021 where she was also experiencing shortness of breath and wheezing. The cough was productive at that time and has since become dry. She was treated initially with antibiotics and steroids with some improvement but the cough persisted and she has subsequently been treated with other rounds of antibiotics and prednisone with out resolution of the cough. She does experience chest pressure along with the cough. She denies any limitations to her daily functioning from dyspnea. Albuterol has provided some relief of the cough when used. She denies any sinus congestion or drainage. She has been prescribed PPI therapy for GERD but has not started these medications. She has elevated the head of her bed over the past year without improvement in the cough. The cough does wake her up about once a week. She  reports having a hoarse voice after talking for a while.   She grew up around second hand smoke as her mother was a smoker and had lung cancer.    Past Medical History:  Diagnosis Date  . Allergy    RAD - PRN ALB MDI   . Anxiety   . Arrhythmia    history of SVT status post ablation in 2000   . Chest pain 11/26/2014  . Chronic headache   . Depression    no meds  . Fibrocystic disease of breast   . Hemorrhoids   . HSV (herpes simplex virus) infection    outbreaks  . IBS (irritable bowel syndrome)    Dr.Robert Vertell Limber in [facility] 2006 colon and EGD normal  CT 2011  . Osteopenia    BMD- 3/13  . Ovarian cyst    left followed by Dr Sela Hilding   . Palpitations 11/26/2014   c/o HR slow and fast, last 2-3 weeks. Had electrical ablation in 2001  . Shoulder pain 11/26/2014  . Skin melanoma ([place])    on back superficial only, Dr Allyson Sabal  . Uterine fibroid    CT scan 1/11     Family History  Problem Relation Age of Onset  . Cancer Maternal Aunt        breast  . Cancer Maternal Aunt        breast  . Cancer Mother        lung  . Heart Problems Father  MVR and MVP  . Cancer Father        skin  . Cancer Maternal Grandmother        breast  . Pulmonary embolism Paternal Grandfather   . Cancer Daughter        skin  . Breast cancer Neg Hx      Social History   Socioeconomic History  . Marital status: Married    Spouse name: Not on file  . Number of children: Not on file  . Years of education: Not on file  . Highest education level: Not on file  Occupational History  . Not on file  Tobacco Use  . Smoking status: Never Smoker  . Smokeless tobacco: Never Used  Vaping Use  . Vaping Use: Never used  Substance and Sexual Activity  . Alcohol use: Yes    Alcohol/week: 0.0 standard drinks    Comment: Heavy in the past. Wine 2 glasses occasionally   . Drug use: Not on file  . Sexual activity: Not on file  Other Topics Concern  . Not on file  Social History Narrative    Tobacco use cigarettes: Never smoked    No smoking   No Tobacco Exposure   Alcohol : yes Heavy in the past: wine 2 glasses    Caffeine: yes   No recreational drug use   No diet   No exercise   Occupation : employed work at [employer] , in [employer] in [place],  [place] Status  : Married    Children girls 1   Seat belt use :yes   Social Determinants of Radio broadcast assistant Strain: Not on file  Food Insecurity: Not on file  Transportation Needs: Not on file  Physical Activity: Not on file  Stress: Not on file  Social Connections: Not on file  Intimate Partner Violence: Not on file     Allergies  Allergen Reactions  . Penicillins Hives, Swelling and Rash    Has patient had a PCN reaction causing immediate rash, facial/tongue/throat swelling, SOB or lightheadedness with hypotension: Yes Has patient had a PCN reaction causing severe rash involving mucus membranes or skin necrosis: Yes Has patient had a PCN reaction that required hospitalization: Yes Has patient had a PCN reaction occurring within the last 10 years: No If all of the above answers are "NO", then may proceed with Cephalosporin use.    Charmaine Downs Peroxide-Benzy Alc] Swelling and Rash    Severe facial swelling, bumps   . Benzoyl Peroxide   . Fosamax [Alendronate] Other (See Comments)    HA  . Levaquin [Levofloxacin] Other (See Comments)    Caused her tendons in her legs to cramp  . Lopressor [Metoprolol Tartrate] Other (See Comments)    fatigue  . Sulfa Antibiotics Rash    rash     Outpatient Medications Prior to Visit  Medication Sig Dispense Refill  . escitalopram (LEXAPRO) 10 MG tablet Take 10 mg by mouth daily.    . fluticasone (FLONASE) 50 MCG/ACT nasal spray Place 1 spray into both nostrils daily as needed for allergies.     . metoprolol succinate (TOPROL-XL) 25 MG 24 hr tablet TAKE 1 TABLET BY MOUTH EVERY DAY 30 tablet 1  . NONFORMULARY OR COMPOUNDED ITEM  [facility]:  Antifungal topical - Terbinafine 3%, Fluconazole 2%, Tea Tree Oil 5%, Ibuprofen 2%, in DMSO suspension #86ml. Apply to affected toenail(s) once at bedtime or twice daily. 30 each 11  .  nystatin-triamcinolone (MYCOLOG II) cream nystatin-triamcinolone 100,000 unit/g-0.1 % topical cream  APPLY TO THE AFFECTED AREA(S) BY TOPICAL ROUTE 2 TIMES PER DAY IN [facility] AND EVENING    . omeprazole (PRILOSEC) 40 MG capsule Take 1 capsule (40 mg total) by mouth daily. 90 capsule 3  . Probiotic Product (PROBIOTIC PO) Take 1 tablet by mouth daily as needed (supplement).    . terbinafine (LAMISIL) 250 MG tablet Take 250 mg by mouth daily.    . VENTOLIN HFA 108 (90 BASE) MCG/ACT inhaler Inhale 2 puffs into the lungs every 4 (four) hours as needed. Shortness of breath or wheezing    . metoprolol tartrate (LOPRESSOR) 50 MG tablet Take 1 tablet (50 mg total) by mouth daily as needed (Do not take more than 1 tablet in 24 hours). Please call to schedule appt with Dr Lovena Le 30 tablet 3   No facility-administered medications prior to visit.    Review of Systems  Constitutional: Negative for chills, diaphoresis, fever, malaise/fatigue and weight loss.  HENT: Negative for congestion, sinus pain and sore throat.   Eyes: Negative.   Respiratory: Positive for cough and sputum production. Negative for hemoptysis, shortness of breath and wheezing.   Cardiovascular: Negative for chest pain, palpitations, orthopnea, claudication, leg swelling and PND.  Gastrointestinal: Positive for heartburn. Negative for abdominal pain, diarrhea, nausea and vomiting.  Genitourinary: Negative.   Musculoskeletal: Negative.   Neurological: Negative for dizziness, weakness and headaches.  Endo/Heme/Allergies: Negative.   Psychiatric/Behavioral: Negative.    Objective:   Vitals:   02/16/21 1336  BP: 118/72  Pulse: (!) 59  Temp: 98.7 F (37.1 C)  TempSrc: Oral  SpO2: 99%  Weight: 131 lb 12.8 oz (59.8 kg)   Height: 5' 2.5" (1.588 m)    Physical Exam Constitutional:      General: She is not in acute distress.    Appearance: Normal appearance. She is normal weight. She is not ill-appearing.  HENT:     Head: Normocephalic and atraumatic.     Nose: Nose normal.     Mouth/Throat:     Mouth: Mucous membranes are moist.     Pharynx: Oropharynx is clear.  Eyes:     General: No scleral icterus.    Conjunctiva/sclera: Conjunctivae normal.     Pupils: Pupils are equal, round, and reactive to light.  Cardiovascular:     Rate and Rhythm: Normal rate and regular rhythm.     Pulses: Normal pulses.     Heart sounds: Normal heart sounds. No murmur heard.   Pulmonary:     Effort: Pulmonary effort is normal.     Breath sounds: Decreased air movement present. No wheezing, rhonchi or rales.  Musculoskeletal:     Right lower leg: No edema.     Left lower leg: No edema.  Skin:    General: Skin is warm and dry.  Neurological:     General: No focal deficit present.     Mental Status: She is alert.  Psychiatric:        Mood and Affect: Mood normal.        Behavior: Behavior normal.        Thought Content: Thought content normal.        Judgment: Judgment normal.    CBC    Component Value Date/Time   WBC 8.5 12/15/2017 1341   RBC 4.70 12/15/2017 1341   HGB 14.4 12/15/2017 1341   HCT 42.2 12/15/2017 1341   PLT 225 12/15/2017 1341   MCV 89.8  12/15/2017 1341   MCH 30.6 12/15/2017 1341   MCHC 34.1 12/15/2017 1341   RDW 12.2 12/15/2017 1341    Chest imaging: CXR 09/30/20 The cardiomediastinal contours are normal. The lungs are clear. Pulmonary vasculature is normal. No consolidation, pleural effusion, or pneumothorax. No acute osseous abnormalities are seen.  CXR 2018 Defibrillator pads are in place. Lungs are clear. Heart size is normal. No pneumothorax or pleural effusion. No acute bony abnormality.  CT Chest 2014 Stable small ground-glass opacities in the right lower lobe. Since   these have been followed at 1 year, one additional follow-up CT  Chest in 3 years is recommended to exclude slow growing  Adenocarcinoma.  PFT: No flowsheet data found.    Assessment & Plan:   Cough - Plan: Ambulatory referral to ENT  Mild intermittent reactive airway disease without complication - Plan: Pulmonary Function Test  Gastroesophageal reflux disease without esophagitis  Hoarseness of voice - Plan: Ambulatory referral to ENT  Shortness of breath - Plan: CT Chest High Resolution  Discussion: Sharon Potter is a 65 year old woman, never smoker who returns to pulmonary clinic for chronic cough.    We will obtain pulmonary function tests and a CT Chest scan for further evaluation of her cough.   We will trial her on Anoro ellipta as inhaled corticosteroids in the Breo Ellipta inhaler likely led to worsening in her hoarseness of voice.  We will also refer her to ENT for the hoarseness of voice.   She is to continue on PPI therapy for GERD along with elevating the head of her bed while sleeping and limiting her alcohol intake.   Potter Jackson, MD [facility] Office: [phone]   Current Outpatient Medications:  .  escitalopram (LEXAPRO) 10 MG tablet, Take 10 mg by mouth daily., Disp: , Rfl:  .  fluticasone (FLONASE) 50 MCG/ACT nasal spray, Place 1 spray into both nostrils daily as needed for allergies. , Disp: , Rfl:  .  metoprolol succinate (TOPROL-XL) 25 MG 24 hr tablet, TAKE 1 TABLET BY MOUTH EVERY DAY, Disp: 30 tablet, Rfl: 1 .  NONFORMULARY OR COMPOUNDED ITEM, [facility]:  Antifungal topical - Terbinafine 3%, Fluconazole 2%, Tea Tree Oil 5%, Ibuprofen 2%, in DMSO suspension #48ml. Apply to affected toenail(s) once at bedtime or twice daily., Disp: 30 each, Rfl: 11 .  nystatin-triamcinolone (MYCOLOG II) cream, nystatin-triamcinolone 100,000 unit/g-0.1 % topical cream  APPLY TO THE AFFECTED AREA(S) BY TOPICAL ROUTE 2 TIMES PER DAY IN  [facility] AND EVENING, Disp: , Rfl:  .  omeprazole (PRILOSEC) 40 MG capsule, Take 1 capsule (40 mg total) by mouth daily., Disp: 90 capsule, Rfl: 3 .  Probiotic Product (PROBIOTIC PO), Take 1 tablet by mouth daily as needed (supplement)., Disp: , Rfl:  .  terbinafine (LAMISIL) 250 MG tablet, Take 250 mg by mouth daily., Disp: , Rfl:  .  umeclidinium-vilanterol (ANORO ELLIPTA) 62.5-25 MCG/INH AEPB, Inhale 1 puff into the lungs daily., Disp: 60 each, Rfl: 3 .  VENTOLIN HFA 108 (90 BASE) MCG/ACT inhaler, Inhale 2 puffs into the lungs every 4 (four) hours as needed. Shortness of breath or wheezing, Disp: , Rfl:  .  metoprolol tartrate (LOPRESSOR) 50 MG tablet, Take 1 tablet (50 mg total) by mouth daily as needed (Do not take more than 1 tablet in 24 hours). Please call to schedule appt with Dr Lovena Le, Disp: 30 tablet, Rfl: 3

## 2021-02-16 NOTE — Patient Instructions (Signed)
We will schedule you for CT Chest scan  We will schedule you for pulmonary function tests at your convenience  Start anoro ellipta inhaler 1 puff daily and continue to use albuterol as needed  We will refer you to ENT for evaluation of your cough and hoarse voice.

## 2021-02-27 ENCOUNTER — Ambulatory Visit (INDEPENDENT_AMBULATORY_CARE_PROVIDER_SITE_OTHER)
Admission: RE | Admit: 2021-02-27 | Discharge: 2021-02-27 | Disposition: A | Discharge status: Home / Self Care | Payer: Medicare Other | Source: Ambulatory Visit | Attending: Pulmonary Disease | Admitting: Pulmonary Disease

## 2021-02-27 ENCOUNTER — Other Ambulatory Visit: Payer: Self-pay

## 2021-02-27 DIAGNOSIS — R0602 Shortness of breath: Secondary | ICD-10-CM | POA: Diagnosis not present

## 2021-03-24 ENCOUNTER — Other Ambulatory Visit: Payer: Self-pay | Admitting: Internal Medicine

## 2021-03-24 DIAGNOSIS — I471 Supraventricular tachycardia: Secondary | ICD-10-CM

## 2021-05-04 DIAGNOSIS — Z85820 Personal history of malignant melanoma of skin: Secondary | ICD-10-CM | POA: Diagnosis not present

## 2021-05-04 DIAGNOSIS — L538 Other specified erythematous conditions: Secondary | ICD-10-CM | POA: Diagnosis not present

## 2021-05-04 DIAGNOSIS — Z85828 Personal history of other malignant neoplasm of skin: Secondary | ICD-10-CM | POA: Diagnosis not present

## 2021-05-04 DIAGNOSIS — L905 Scar conditions and fibrosis of skin: Secondary | ICD-10-CM | POA: Diagnosis not present

## 2021-05-04 DIAGNOSIS — D225 Melanocytic nevi of trunk: Secondary | ICD-10-CM | POA: Diagnosis not present

## 2021-05-04 DIAGNOSIS — L814 Other melanin hyperpigmentation: Secondary | ICD-10-CM | POA: Diagnosis not present

## 2021-05-04 DIAGNOSIS — L989 Disorder of the skin and subcutaneous tissue, unspecified: Secondary | ICD-10-CM | POA: Diagnosis not present

## 2021-05-04 DIAGNOSIS — Z872 Personal history of diseases of the skin and subcutaneous tissue: Secondary | ICD-10-CM | POA: Diagnosis not present

## 2021-05-04 DIAGNOSIS — L820 Inflamed seborrheic keratosis: Secondary | ICD-10-CM | POA: Diagnosis not present

## 2021-05-04 DIAGNOSIS — L239 Allergic contact dermatitis, unspecified cause: Secondary | ICD-10-CM | POA: Diagnosis not present

## 2021-05-04 DIAGNOSIS — L821 Other seborrheic keratosis: Secondary | ICD-10-CM | POA: Diagnosis not present

## 2021-05-04 DIAGNOSIS — D485 Neoplasm of uncertain behavior of skin: Secondary | ICD-10-CM | POA: Diagnosis not present

## 2021-05-21 DIAGNOSIS — Z9181 History of falling: Secondary | ICD-10-CM | POA: Diagnosis not present

## 2021-05-21 DIAGNOSIS — Z6822 Body mass index (BMI) 22.0-22.9, adult: Secondary | ICD-10-CM | POA: Diagnosis not present

## 2021-05-21 DIAGNOSIS — R911 Solitary pulmonary nodule: Secondary | ICD-10-CM | POA: Diagnosis not present

## 2021-05-21 DIAGNOSIS — I471 Supraventricular tachycardia: Secondary | ICD-10-CM | POA: Diagnosis not present

## 2021-05-21 DIAGNOSIS — I700 Atherosclerosis of aorta: Secondary | ICD-10-CM | POA: Diagnosis not present

## 2021-05-21 DIAGNOSIS — E7800 Pure hypercholesterolemia, unspecified: Secondary | ICD-10-CM | POA: Diagnosis not present

## 2021-05-21 DIAGNOSIS — R053 Chronic cough: Secondary | ICD-10-CM | POA: Diagnosis not present

## 2021-05-21 DIAGNOSIS — J45909 Unspecified asthma, uncomplicated: Secondary | ICD-10-CM | POA: Diagnosis not present

## 2021-05-21 DIAGNOSIS — I2510 Atherosclerotic heart disease of native coronary artery without angina pectoris: Secondary | ICD-10-CM | POA: Diagnosis not present

## 2021-05-21 DIAGNOSIS — K219 Gastro-esophageal reflux disease without esophagitis: Secondary | ICD-10-CM | POA: Diagnosis not present

## 2021-08-28 DIAGNOSIS — Z23 Encounter for immunization: Secondary | ICD-10-CM | POA: Diagnosis not present

## 2021-09-11 DIAGNOSIS — M545 Low back pain, unspecified: Secondary | ICD-10-CM | POA: Diagnosis not present

## 2021-09-11 DIAGNOSIS — R194 Change in bowel habit: Secondary | ICD-10-CM | POA: Diagnosis not present

## 2021-09-11 DIAGNOSIS — K6289 Other specified diseases of anus and rectum: Secondary | ICD-10-CM | POA: Diagnosis not present

## 2021-09-11 DIAGNOSIS — R634 Abnormal weight loss: Secondary | ICD-10-CM | POA: Diagnosis not present

## 2021-09-11 DIAGNOSIS — R1030 Lower abdominal pain, unspecified: Secondary | ICD-10-CM | POA: Diagnosis not present

## 2021-09-14 ENCOUNTER — Other Ambulatory Visit: Payer: Self-pay | Admitting: Physician Assistant

## 2021-09-14 DIAGNOSIS — R1030 Lower abdominal pain, unspecified: Secondary | ICD-10-CM

## 2021-10-15 ENCOUNTER — Other Ambulatory Visit: Payer: Medicare Other

## 2021-11-12 DIAGNOSIS — D485 Neoplasm of uncertain behavior of skin: Secondary | ICD-10-CM | POA: Diagnosis not present

## 2021-11-12 DIAGNOSIS — L570 Actinic keratosis: Secondary | ICD-10-CM | POA: Diagnosis not present

## 2021-11-25 DIAGNOSIS — R911 Solitary pulmonary nodule: Secondary | ICD-10-CM | POA: Diagnosis not present

## 2021-11-25 DIAGNOSIS — Z0000 Encounter for general adult medical examination without abnormal findings: Secondary | ICD-10-CM | POA: Diagnosis not present

## 2021-11-25 DIAGNOSIS — E7800 Pure hypercholesterolemia, unspecified: Secondary | ICD-10-CM | POA: Diagnosis not present

## 2021-11-25 DIAGNOSIS — M79644 Pain in right finger(s): Secondary | ICD-10-CM | POA: Diagnosis not present

## 2021-11-25 DIAGNOSIS — Z23 Encounter for immunization: Secondary | ICD-10-CM | POA: Diagnosis not present

## 2021-11-25 DIAGNOSIS — I700 Atherosclerosis of aorta: Secondary | ICD-10-CM | POA: Diagnosis not present

## 2021-11-27 ENCOUNTER — Other Ambulatory Visit: Payer: Self-pay | Admitting: Internal Medicine

## 2021-11-27 ENCOUNTER — Other Ambulatory Visit: Payer: Self-pay

## 2021-11-27 ENCOUNTER — Ambulatory Visit
Admission: RE | Admit: 2021-11-27 | Discharge: 2021-11-27 | Disposition: A | Discharge status: Home / Self Care | Payer: Medicare Other | Source: Ambulatory Visit | Attending: Physician Assistant | Admitting: Physician Assistant

## 2021-11-27 DIAGNOSIS — I471 Supraventricular tachycardia: Secondary | ICD-10-CM

## 2021-11-27 DIAGNOSIS — R1030 Lower abdominal pain, unspecified: Secondary | ICD-10-CM

## 2021-11-27 MED ORDER — IOPAMIDOL (ISOVUE-300) INJECTION 61%
100.0000 mL | Freq: Once | INTRAVENOUS | Status: AC | PRN
  Administered 2021-11-27: 100 mL via INTRAVENOUS

## 2021-12-07 DIAGNOSIS — K589 Irritable bowel syndrome without diarrhea: Secondary | ICD-10-CM | POA: Diagnosis not present

## 2021-12-07 DIAGNOSIS — R197 Diarrhea, unspecified: Secondary | ICD-10-CM | POA: Diagnosis not present

## 2021-12-14 ENCOUNTER — Other Ambulatory Visit: Payer: Self-pay

## 2021-12-14 ENCOUNTER — Encounter: Payer: Self-pay | Admitting: Internal Medicine

## 2021-12-14 ENCOUNTER — Ambulatory Visit: Payer: Medicare Other | Admitting: Internal Medicine

## 2021-12-14 VITALS — BP 120/64 | HR 62 | Ht 62.5 in | Wt 126.2 lb

## 2021-12-14 DIAGNOSIS — R002 Palpitations: Secondary | ICD-10-CM | POA: Diagnosis not present

## 2021-12-14 DIAGNOSIS — R197 Diarrhea, unspecified: Secondary | ICD-10-CM | POA: Diagnosis not present

## 2021-12-14 NOTE — Patient Instructions (Addendum)
Medication Instructions:  Your physician recommends that you continue on your current medications as directed. Please refer to the Current Medication list given to you today.  Labwork: None ordered.  Testing/Procedures: None ordered.  Follow-Up: Your physician wants you to follow-up in: one year with Cristopher Peru, MD  You will receive a reminder letter in the mail two months in advance. If you don't receive a letter, please call our office to schedule the follow-up appointment.   Any Other Special Instructions Will Be Listed Below (If Applicable).  If you need a refill on your cardiac medications before your next appointment, please call your pharmacy.

## 2021-12-14 NOTE — Progress Notes (Signed)
HPI Sharon Potter returns today for followup. She is a pleasant 65 yo woman with a remote h/o SVT who underwent catheter ablation over 20 years ago. She has had some palpitations and notes an episode in August when her HR was in the 160 range for several minutes. She did not seek medical attention. She has not had chest pain. She notes the sensation of an irregular hard heart beat at times which will last for up to 10 minutes. She is back taking the toprol and appears that her symptoms are better. when I last saw her I asked her to obtain a Kardia mobile device. She has it but not used it.  Allergies  Allergen Reactions   Penicillins Hives, Swelling and Rash    Has patient had a PCN reaction causing immediate rash, facial/tongue/throat swelling, SOB or lightheadedness with hypotension: Yes Has patient had a PCN reaction causing severe rash involving mucus membranes or skin necrosis: Yes Has patient had a PCN reaction that required hospitalization: Yes Has patient had a PCN reaction occurring within the last 10 years: No If all of the above answers are "NO", then may proceed with Cephalosporin use.     Peroxyl [Hydrogen Peroxide-Benzy Alc] Swelling and Rash    Severe facial swelling, bumps    Benzoyl Peroxide    Fosamax [Alendronate] Other (See Comments)    HA   Levaquin [Levofloxacin] Other (See Comments)    Caused her tendons in her legs to cramp   Lopressor [Metoprolol Tartrate] Other (See Comments)    fatigue   Sulfa Antibiotics Rash    rash     Current Outpatient Medications  Medication Sig Dispense Refill   escitalopram (LEXAPRO) 10 MG tablet Take 10 mg by mouth daily.     fluticasone (FLONASE) 50 MCG/ACT nasal spray Place 1 spray into both nostrils daily as needed for allergies.      metoprolol succinate (TOPROL-XL) 25 MG 24 hr tablet Take 1 tablet (25 mg total) by mouth daily. Please make overdue appt with Dr. Lovena Le before anymore refills. Thank you 1st attempt 30 tablet  0   NONFORMULARY OR COMPOUNDED ITEM [facility]:  Antifungal topical - Terbinafine 3%, Fluconazole 2%, Tea Tree Oil 5%, Ibuprofen 2%, in DMSO suspension #40ml. Apply to affected toenail(s) once at bedtime or twice daily. 30 each 11   nystatin-triamcinolone (MYCOLOG II) cream nystatin-triamcinolone 100,000 unit/g-0.1 % topical cream  APPLY TO THE AFFECTED AREA(S) BY TOPICAL ROUTE 2 TIMES PER DAY IN THEMORNING AND EVENING     omeprazole (PRILOSEC) 40 MG capsule Take 1 capsule (40 mg total) by mouth daily. 90 capsule 3   Probiotic Product (PROBIOTIC PO) Take 1 tablet by mouth daily as needed (supplement).     terbinafine (LAMISIL) 250 MG tablet Take 250 mg by mouth daily.     umeclidinium-vilanterol (ANORO ELLIPTA) 62.5-25 MCG/INH AEPB Inhale 1 puff into the lungs daily. 60 each 3   VENTOLIN HFA 108 (90 BASE) MCG/ACT inhaler Inhale 2 puffs into the lungs every 4 (four) hours as needed. Shortness of breath or wheezing     metoprolol tartrate (LOPRESSOR) 50 MG tablet Take 1 tablet (50 mg total) by mouth daily as needed (Do not take more than 1 tablet in 24 hours). Please call to schedule appt with Dr Lovena Le 30 tablet 3   rosuvastatin (CRESTOR) 10 MG tablet 1 tablet     No current facility-administered medications for this visit.     Past Medical History:  Diagnosis  Date   Allergy    RAD - PRN ALB MDI    Anxiety    Arrhythmia    history of SVT status post ablation in 2000    Chest pain 11/26/2014   Chronic headache    Depression    no meds   Fibrocystic disease of breast    Hemorrhoids    HSV (herpes simplex virus) infection    outbreaks   IBS (irritable bowel syndrome)    Dr.Robert Vertell Limber in [place] 2006 colon and EGD normal  CT 2011   Osteopenia    BMD- 3/13   Ovarian cyst    left followed by Dr Sela Hilding    Palpitations 11/26/2014   c/o HR slow and fast, last 2-3 weeks. Had electrical ablation in 2001   Shoulder pain 11/26/2014   Skin melanoma ([place])    on back  superficial only, Dr Allyson Sabal   Uterine fibroid    CT scan 1/11    ROS:   All systems reviewed and negative except as noted in the HPI.   Past Surgical History:  Procedure Laterality Date   ABLATION  2000 OR 2001   NASAL SEPTUM SURGERY     repair     Family History  Problem Relation Age of Onset   Cancer Maternal Aunt        breast   Cancer Maternal Aunt        breast   Cancer Mother        lung   Heart Problems Father        MVR and MVP   Cancer Father        skin   Cancer Maternal Grandmother        breast   Pulmonary embolism Paternal Grandfather    Cancer Daughter        skin   Breast cancer Neg Hx      Social History   Socioeconomic History   Marital status: Married    Spouse name: Not on file   Number of children: Not on file   Years of education: Not on file   Highest education level: Not on file  Occupational History   Not on file  Tobacco Use   Smoking status: Never   Smokeless tobacco: Never  Vaping Use   Vaping Use: Never used  Substance and Sexual Activity   Alcohol use: Yes    Alcohol/week: 0.0 standard drinks    Comment: Heavy in the past. Wine 2 glasses occasionally    Drug use: Not on file   Sexual activity: Not on file  Other Topics Concern   Not on file  Social History Narrative   Tobacco use cigarettes: Never smoked    No smoking   No Tobacco Exposure   Alcohol : yes Heavy in the past: wine 2 glasses    Caffeine: yes   No recreational drug use   No diet   No exercise   Occupation : employed work at [employer] , in [employer] in [place],  [place] Status  : Married    Children girls 1   Seat belt use :yes   Social Determinants of Radio broadcast assistant Strain: Not on file  Food Insecurity: Not on file  Transportation Needs: Not on file  Physical Activity: Not on file  Stress: Not on file  Social Connections: Not on file  Intimate Partner Violence: Not on file     Ht 5' 2.5" (  1.588 m)     Wt 126 lb 3.2 oz (57.2 kg)    BMI 22.71 kg/m   Physical Exam:  Well appearing NAD HEENT: Unremarkable Neck:  No JVD, no thyromegally Lymphatics:  No adenopathy Back:  No CVA tenderness Lungs:  Clear with no wheezes HEART:  Regular rate rhythm, no murmurs, no rubs, no clicks Abd:  soft, positive bowel sounds, no organomegally, no rebound, no guarding Ext:  2 plus pulses, no edema, no cyanosis, no clubbing Skin:  No rashes no nodules Neuro:  CN II through XII intact, motor grossly intact  EKG - nsr at 62/min  Assess/Plan:  1. Palpitations - the etiology is unclear. She appears to be better. We discussed the diagnostic possibilities. She will undergo watchful waiting and continue taking her toprol. I asked her to use her Jodelle Red mobile device to record her arrhythmias.  2. SVT - she is s/p remote ablation in 2000. 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 Minimal recurrent symptoms. 3. Dyslipidemia - she has started crestor. No muscle aches. 4. Claudication - very mild. She has mild evidence of obstruction by non-invasive vascular studies.  Royston Sinner Shey Bartmess,MD

## 2021-12-15 ENCOUNTER — Ambulatory Visit: Payer: Medicare Other | Admitting: Pulmonary Disease

## 2021-12-15 ENCOUNTER — Encounter: Payer: Self-pay | Admitting: Pulmonary Disease

## 2021-12-15 VITALS — BP 118/72 | HR 57 | Ht 62.5 in | Wt 125.9 lb

## 2021-12-15 DIAGNOSIS — K219 Gastro-esophageal reflux disease without esophagitis: Secondary | ICD-10-CM

## 2021-12-15 DIAGNOSIS — R918 Other nonspecific abnormal finding of lung field: Secondary | ICD-10-CM

## 2021-12-15 DIAGNOSIS — R053 Chronic cough: Secondary | ICD-10-CM

## 2021-12-15 MED ORDER — OMEPRAZOLE 40 MG PO CPDR: 40.0000 mg | DELAYED_RELEASE_CAPSULE | Freq: Every day | ORAL | 3 refills | Status: DC | PRN

## 2021-12-15 NOTE — Progress Notes (Signed)
Synopsis: Referred in 12/2020 for chronic cough by Daiva Eves, MD  Subjective:   PATIENT ID: Sharon Potter GENDER: female DOB: June 08, 1956, MRN: [id number]  HPI  Chief Complaint  Patient presents with   Follow-up    10 mo f/u for cough. Still has a dry cough. Denies any worsening SOB or wheezing.    Sharon Potter is a 65 year old woman, never smoker who returns to pulmonary clinic for chronic cough.   Her cough is overall improved and more intermittent at this time. She does notice more coughing episodes at night. She has a humidifier and air purifier in her bedroom since last visit. She is also wearing a mask when working around her horses and animals to cut down on dust exposure.   She is not using omeprazole. She continues to sleep elevated. She is using flonase PRN and does have runny nose when working outside and some post-nasal drainage. She does report some white mucous production. She does use anoro as needed but denies relief when used for coughing.   She has a new 11 month old granddaughter.  OV 02/16/21 She continues to have persistent cough with cough spells along with intermittent wheezing and shortness of breath. She did not tolerate the breo ellipta as this made her voice increasingly hoarse, so this was stopped. She reports it did help with her breathing a bit. She did start omeprazole 40mg  daily with some improvement in her cough. She is sleeping with the head of the bed elevated as well. She has cut back on her evening wine.  She is accompanied by her daughter. They are concerned that the cough remains an issue and that it has been ongoing for many months now with no significant improvement.    OV 01/07/21 She has had a cough since August 2021 where she was also experiencing shortness of breath and wheezing. The cough was productive at that time and has since become dry. She was treated initially with antibiotics and steroids with some improvement but the cough persisted  and she has subsequently been treated with other rounds of antibiotics and prednisone with out resolution of the cough. She does experience chest pressure along with the cough. She denies any limitations to her daily functioning from dyspnea. Albuterol has provided some relief of the cough when used. She denies any sinus congestion or drainage. She has been prescribed PPI therapy for GERD but has not started these medications. She has elevated the head of her bed over the past year without improvement in the cough. The cough does wake her up about once a week. She reports having a hoarse voice after talking for a while.   She grew up around second hand smoke as her mother was a smoker and had lung cancer.    Past Medical History:  Diagnosis Date   Allergy    RAD - PRN ALB MDI    Anxiety    Arrhythmia    history of SVT status post ablation in 2000    Chest pain 11/26/2014   Chronic headache    Depression    no meds   Fibrocystic disease of breast    Hemorrhoids    HSV (herpes simplex virus) infection    outbreaks   IBS (irritable bowel syndrome)    Dr.Robert Vertell Limber in [place] 2006 colon and EGD normal  CT 2011   Osteopenia    BMD- 3/13   Ovarian cyst    left followed by Dr Sela Hilding  Palpitations 11/26/2014   c/o HR slow and fast, last 2-3 weeks. Had electrical ablation in 2001   Shoulder pain 11/26/2014   Skin melanoma ([place])    on back superficial only, Dr Allyson Sabal   Uterine fibroid    CT scan 1/11     Family History  Problem Relation Age of Onset   Cancer Maternal Aunt        breast   Cancer Maternal Aunt        breast   Cancer Mother        lung   Heart Problems Father        MVR and MVP   Cancer Father        skin   Cancer Maternal Grandmother        breast   Pulmonary embolism Paternal Grandfather    Cancer Daughter        skin   Breast cancer Neg Hx      Social History   Socioeconomic History   Marital status: Married    Spouse name: Not on file    Number of children: Not on file   Years of education: Not on file   Highest education level: Not on file  Occupational History   Not on file  Tobacco Use   Smoking status: Never   Smokeless tobacco: Never  Vaping Use   Vaping Use: Never used  Substance and Sexual Activity   Alcohol use: Yes    Alcohol/week: 0.0 standard drinks    Comment: Heavy in the past. Wine 2 glasses occasionally    Drug use: Not on file   Sexual activity: Not on file  Other Topics Concern   Not on file  Social History Narrative   Tobacco use cigarettes: Never smoked    No smoking   No Tobacco Exposure   Alcohol : yes Heavy in the past: wine 2 glasses    Caffeine: yes   No recreational drug use   No diet   No exercise   Occupation : employed work at [employer] , in [employer] in [place],  [place] Status  : Married    Children girls 1   Seat belt use :yes   Social Determinants of Radio broadcast assistant Strain: Not on file  Food Insecurity: Not on file  Transportation Needs: Not on file  Physical Activity: Not on file  Stress: Not on file  Social Connections: Not on file  Intimate Partner Violence: Not on file     Allergies  Allergen Reactions   Penicillins Hives, Swelling and Rash    Has patient had a PCN reaction causing immediate rash, facial/tongue/throat swelling, SOB or lightheadedness with hypotension: Yes Has patient had a PCN reaction causing severe rash involving mucus membranes or skin necrosis: Yes Has patient had a PCN reaction that required hospitalization: Yes Has patient had a PCN reaction occurring within the last 10 years: No If all of the above answers are "NO", then may proceed with Cephalosporin use.     Peroxyl [Hydrogen Peroxide-Benzy Alc] Swelling and Rash    Severe facial swelling, bumps    Benzoyl Peroxide    Fosamax [Alendronate] Other (See Comments)    HA   Levaquin [Levofloxacin] Other (See Comments)    Caused her tendons in her  legs to cramp   Lopressor [Metoprolol Tartrate] Other (See Comments)    fatigue   Sulfa Antibiotics Rash    rash  Outpatient Medications Prior to Visit  Medication Sig Dispense Refill   escitalopram (LEXAPRO) 10 MG tablet Take 10 mg by mouth daily.     fluticasone (FLONASE) 50 MCG/ACT nasal spray Place 1 spray into both nostrils daily as needed for allergies.      metoprolol succinate (TOPROL-XL) 25 MG 24 hr tablet Take 1 tablet (25 mg total) by mouth daily. Please make overdue appt with Dr. Lovena Le before anymore refills. Thank you 1st attempt 30 tablet 0   NONFORMULARY OR COMPOUNDED ITEM [facility]:  Antifungal topical - Terbinafine 3%, Fluconazole 2%, Tea Tree Oil 5%, Ibuprofen 2%, in DMSO suspension #18ml. Apply to affected toenail(s) once at bedtime or twice daily. 30 each 11   nystatin-triamcinolone (MYCOLOG II) cream nystatin-triamcinolone 100,000 unit/g-0.1 % topical cream  APPLY TO THE AFFECTED AREA(S) BY TOPICAL ROUTE 2 TIMES PER DAY IN THEMORNING AND EVENING     Probiotic Product (PROBIOTIC PO) Take 1 tablet by mouth daily as needed (supplement).     rosuvastatin (CRESTOR) 10 MG tablet 1 tablet     terbinafine (LAMISIL) 250 MG tablet Take 250 mg by mouth daily.     VENTOLIN HFA 108 (90 BASE) MCG/ACT inhaler Inhale 2 puffs into the lungs every 4 (four) hours as needed. Shortness of breath or wheezing     omeprazole (PRILOSEC) 40 MG capsule Take 1 capsule (40 mg total) by mouth daily. 90 capsule 3   umeclidinium-vilanterol (ANORO ELLIPTA) 62.5-25 MCG/INH AEPB Inhale 1 puff into the lungs daily. 60 each 3   metoprolol tartrate (LOPRESSOR) 50 MG tablet Take 1 tablet (50 mg total) by mouth daily as needed (Do not take more than 1 tablet in 24 hours). Please call to schedule appt with Dr Lovena Le 30 tablet 3   No facility-administered medications prior to visit.    Review of Systems  Constitutional:  Negative for chills, diaphoresis, fever, malaise/fatigue and weight loss.   HENT:  Negative for congestion, sinus pain and sore throat.   Eyes: Negative.   Respiratory:  Positive for cough and sputum production. Negative for hemoptysis, shortness of breath and wheezing.   Cardiovascular:  Negative for chest pain, palpitations, orthopnea, claudication, leg swelling and PND.  Gastrointestinal:  Negative for abdominal pain, diarrhea, heartburn, nausea and vomiting.  Genitourinary: Negative.   Musculoskeletal: Negative.   Neurological:  Negative for dizziness, weakness and headaches.  Endo/Heme/Allergies: Negative.   Psychiatric/Behavioral: Negative.     Objective:   Vitals:   12/15/21 1115  BP: 118/72  Pulse: (!) 57  SpO2: 98%  Weight: 125 lb 14.4 oz (57.1 kg)  Height: 5' 2.5" (1.588 m)    Physical Exam Constitutional:      General: She is not in acute distress.    Appearance: Normal appearance. She is normal weight. She is not ill-appearing.  HENT:     Head: Normocephalic and atraumatic.  Eyes:     General: No scleral icterus.    Conjunctiva/sclera: Conjunctivae normal.  Cardiovascular:     Rate and Rhythm: Normal rate and regular rhythm.     Pulses: Normal pulses.     Heart sounds: Normal heart sounds. No murmur heard. Pulmonary:     Effort: Pulmonary effort is normal.     Breath sounds: No wheezing, rhonchi or rales.  Musculoskeletal:     Right lower leg: No edema.     Left lower leg: No edema.  Skin:    General: Skin is warm and dry.  Neurological:     General: No focal  deficit present.     Mental Status: She is alert.  Psychiatric:        Mood and Affect: Mood normal.        Behavior: Behavior normal.        Thought Content: Thought content normal.        Judgment: Judgment normal.   CBC    Component Value Date/Time   WBC 8.5 12/15/2017 1341   RBC 4.70 12/15/2017 1341   HGB 14.4 12/15/2017 1341   HCT 42.2 12/15/2017 1341   PLT 225 12/15/2017 1341   MCV 89.8 12/15/2017 1341   MCH 30.6 12/15/2017 1341   MCHC 34.1 12/15/2017  1341   RDW 12.2 12/15/2017 1341   Chest imaging: HRCT Chest 02/27/2021 1. No evidence of fibrotic interstitial lung disease. No significant air trapping on expiratory phase imaging. 2. Incidental small pulmonary nodules, the largest a ground-glass nodule measuring 6 mm. Initial follow-up with CT at 6-12 months is recommended to confirm persistence. If persistent, repeat CT is recommended every 2 years until 5 years of stability has been established. This recommendation follows the consensus statement: Guidelines for Management of Incidental Pulmonary Nodules Detected on CT Images: From the Fleischner Society 2017; Radiology 2017; 284:228-243. 3. Coronary artery disease.  CXR 09/30/20 The cardiomediastinal contours are normal. The lungs are clear. Pulmonary vasculature is normal. No consolidation, pleural effusion, or pneumothorax. No acute osseous abnormalities are seen.  CXR 2018 Defibrillator pads are in place. Lungs are clear. Heart size is normal. No pneumothorax or pleural effusion. No acute bony abnormality.  CT Chest 2014 Stable small ground-glass opacities in the right lower lobe.  Since  these have been followed at 1 year, one additional follow-up CT  Chest in 3 years is recommended to exclude slow growing  Adenocarcinoma.  PFT: No flowsheet data found.    Assessment & Plan:   Chronic cough  Gastroesophageal reflux disease without esophagitis - Plan: omeprazole (PRILOSEC) 40 MG capsule  Pulmonary nodules - Plan: CT Chest Wo Contrast  Discussion: Sharon Potter is a 65 year old woman, never smoker who returns to pulmonary clinic for chronic cough.    Her cough seems secondary to GERD which has improved with elevating the head of her bed when sleeping and reducing evening wine intake.   Trial of Anoro ellipta did not provide much relief and this medication can be discontinued. She can use omeprazole as needed for relfux issues and flonase as needed for sinus  congestion/post-nasal drainage.   She continues to have intermittent hoarseness of voice. We have offered ENT evaluation if needed in the future.   We will repeat CT chest in April for monitoring of pulmonary nodules. If nodules are stable no further follow up needed.  Follow up in April 2023.   Freda Jackson, MD [facility] Office: [phone]   Current Outpatient Medications:    escitalopram (LEXAPRO) 10 MG tablet, Take 10 mg by mouth daily., Disp: , Rfl:    fluticasone (FLONASE) 50 MCG/ACT nasal spray, Place 1 spray into both nostrils daily as needed for allergies. , Disp: , Rfl:    metoprolol succinate (TOPROL-XL) 25 MG 24 hr tablet, Take 1 tablet (25 mg total) by mouth daily. Please make overdue appt with Dr. Lovena Le before anymore refills. Thank you 1st attempt, Disp: 30 tablet, Rfl: 0   NONFORMULARY OR COMPOUNDED ITEM, [facility]:  Antifungal topical - Terbinafine 3%, Fluconazole 2%, Tea Tree Oil 5%, Ibuprofen 2%, in DMSO suspension #32ml. Apply to  affected toenail(s) once at bedtime or twice daily., Disp: 30 each, Rfl: 11   nystatin-triamcinolone (MYCOLOG II) cream, nystatin-triamcinolone 100,000 unit/g-0.1 % topical cream  APPLY TO THE AFFECTED AREA(S) BY TOPICAL ROUTE 2 TIMES PER DAY IN THEMORNING AND EVENING, Disp: , Rfl:    Probiotic Product (PROBIOTIC PO), Take 1 tablet by mouth daily as needed (supplement)., Disp: , Rfl:    rosuvastatin (CRESTOR) 10 MG tablet, 1 tablet, Disp: , Rfl:    terbinafine (LAMISIL) 250 MG tablet, Take 250 mg by mouth daily., Disp: , Rfl:    VENTOLIN HFA 108 (90 BASE) MCG/ACT inhaler, Inhale 2 puffs into the lungs every 4 (four) hours as needed. Shortness of breath or wheezing, Disp: , Rfl:    metoprolol tartrate (LOPRESSOR) 50 MG tablet, Take 1 tablet (50 mg total) by mouth daily as needed (Do not take more than 1 tablet in 24 hours). Please call to schedule appt with Dr Lovena Le, Disp: 30 tablet, Rfl: 3   omeprazole  (PRILOSEC) 40 MG capsule, Take 1 capsule (40 mg total) by mouth daily as needed., Disp: 90 capsule, Rfl: 3

## 2021-12-15 NOTE — Patient Instructions (Addendum)
Ok to stop using anoro inhaler due to lack of improvement in cough.  Use omeprazole 40mg  daily as needed for increased cough symptoms due to reflux disease.  We will check a CT Chest scan in April to follow up on the pulmonary nodules.

## 2021-12-20 ENCOUNTER — Other Ambulatory Visit: Payer: Self-pay | Admitting: Internal Medicine

## 2021-12-20 DIAGNOSIS — I471 Supraventricular tachycardia: Secondary | ICD-10-CM

## 2022-01-21 DIAGNOSIS — Z9181 History of falling: Secondary | ICD-10-CM | POA: Diagnosis not present

## 2022-01-21 DIAGNOSIS — Z0000 Encounter for general adult medical examination without abnormal findings: Secondary | ICD-10-CM | POA: Diagnosis not present

## 2022-01-21 DIAGNOSIS — E785 Hyperlipidemia, unspecified: Secondary | ICD-10-CM | POA: Diagnosis not present

## 2022-01-21 DIAGNOSIS — Z6822 Body mass index (BMI) 22.0-22.9, adult: Secondary | ICD-10-CM | POA: Diagnosis not present

## 2022-01-21 DIAGNOSIS — M816 Localized osteoporosis [Lequesne]: Secondary | ICD-10-CM | POA: Diagnosis not present

## 2022-01-21 DIAGNOSIS — N390 Urinary tract infection, site not specified: Secondary | ICD-10-CM | POA: Diagnosis not present

## 2022-01-22 ENCOUNTER — Other Ambulatory Visit: Payer: Self-pay | Admitting: Family Medicine

## 2022-01-22 DIAGNOSIS — Z1231 Encounter for screening mammogram for malignant neoplasm of breast: Secondary | ICD-10-CM

## 2022-01-27 ENCOUNTER — Ambulatory Visit
Admission: RE | Admit: 2022-01-27 | Discharge: 2022-01-27 | Disposition: A | Discharge status: Home / Self Care | Payer: Medicare Other | Source: Ambulatory Visit | Attending: Family Medicine | Admitting: Family Medicine

## 2022-01-27 ENCOUNTER — Other Ambulatory Visit: Payer: Self-pay

## 2022-01-27 DIAGNOSIS — Z1231 Encounter for screening mammogram for malignant neoplasm of breast: Secondary | ICD-10-CM

## 2022-02-25 DIAGNOSIS — R7303 Prediabetes: Secondary | ICD-10-CM | POA: Diagnosis not present

## 2022-02-25 DIAGNOSIS — E7800 Pure hypercholesterolemia, unspecified: Secondary | ICD-10-CM | POA: Diagnosis not present

## 2022-03-08 DIAGNOSIS — Z809 Family history of malignant neoplasm, unspecified: Secondary | ICD-10-CM | POA: Diagnosis not present

## 2022-03-11 DIAGNOSIS — K591 Functional diarrhea: Secondary | ICD-10-CM | POA: Diagnosis not present

## 2022-03-11 DIAGNOSIS — D124 Benign neoplasm of descending colon: Secondary | ICD-10-CM | POA: Diagnosis not present

## 2022-03-11 DIAGNOSIS — D122 Benign neoplasm of ascending colon: Secondary | ICD-10-CM | POA: Diagnosis not present

## 2022-03-11 DIAGNOSIS — K5730 Diverticulosis of large intestine without perforation or abscess without bleeding: Secondary | ICD-10-CM | POA: Diagnosis not present

## 2022-03-16 DIAGNOSIS — D122 Benign neoplasm of ascending colon: Secondary | ICD-10-CM | POA: Diagnosis not present

## 2022-03-16 DIAGNOSIS — D124 Benign neoplasm of descending colon: Secondary | ICD-10-CM | POA: Diagnosis not present

## 2022-03-29 ENCOUNTER — Ambulatory Visit (INDEPENDENT_AMBULATORY_CARE_PROVIDER_SITE_OTHER)
Admission: RE | Admit: 2022-03-29 | Discharge: 2022-03-29 | Disposition: A | Discharge status: Home / Self Care | Payer: Medicare Other | Source: Ambulatory Visit | Attending: Pulmonary Disease | Admitting: Pulmonary Disease

## 2022-03-29 DIAGNOSIS — R911 Solitary pulmonary nodule: Secondary | ICD-10-CM | POA: Diagnosis not present

## 2022-03-29 DIAGNOSIS — I700 Atherosclerosis of aorta: Secondary | ICD-10-CM | POA: Diagnosis not present

## 2022-03-29 DIAGNOSIS — R918 Other nonspecific abnormal finding of lung field: Secondary | ICD-10-CM | POA: Diagnosis not present

## 2022-04-14 ENCOUNTER — Encounter: Payer: Self-pay | Admitting: Pulmonary Disease

## 2022-04-14 ENCOUNTER — Ambulatory Visit: Payer: Medicare Other | Admitting: Pulmonary Disease

## 2022-04-14 VITALS — BP 120/72 | HR 60 | Ht 62.5 in | Wt 127.0 lb

## 2022-04-14 DIAGNOSIS — R918 Other nonspecific abnormal finding of lung field: Secondary | ICD-10-CM | POA: Diagnosis not present

## 2022-04-14 DIAGNOSIS — R053 Chronic cough: Secondary | ICD-10-CM | POA: Diagnosis not present

## 2022-04-14 DIAGNOSIS — J4520 Mild intermittent asthma, uncomplicated: Secondary | ICD-10-CM | POA: Diagnosis not present

## 2022-04-14 MED ORDER — ANORO ELLIPTA 62.5-25 MCG/ACT IN AEPB: 1.0000 | INHALATION_SPRAY | Freq: Every day | RESPIRATORY_TRACT | 5 refills | Status: DC

## 2022-04-14 MED ORDER — FLUTICASONE PROPIONATE HFA 110 MCG/ACT IN AERO: 2.0000 | INHALATION_SPRAY | Freq: Two times a day (BID) | RESPIRATORY_TRACT | 12 refills | Status: DC

## 2022-04-14 NOTE — Progress Notes (Signed)
? ?Synopsis: Referred in 12/2020 for chronic cough by Daiva Eves, MD ? ?Subjective:  ? ?PATIENT ID: Sharon Potter GENDER: female DOB: October 14, 1956, MRN: [id number] ? ?HPI ? ?Chief Complaint  ?Patient presents with  ? Follow-up  ?  F/U after CT scan. States the cough returned about 2 weeks ago. Productive cough with yellow phlegm. The Anoro has helped.   ? ?Sharon Potter is a 66 year old woman, never smoker who returns to pulmonary clinic for chronic cough.  ? ?Her cough was better until 2 weeks ago and she started taking zyrtec which has helped. She has mucous production. No sinus congestion or drainage.  ? ?She has been using anoro again daily with some relief. She reports some wheezing at night time. ? ?CT Chest scan shows stable lung nodules.  ? ?OV 12/15/21 ?Her cough is overall improved and more intermittent at this time. She does notice more coughing episodes at night. She has a humidifier and air purifier in her bedroom since last visit. She is also wearing a mask when working around her horses and animals to cut down on dust exposure.  ? ?She is not using omeprazole. She continues to sleep elevated. She is using flonase PRN and does have runny nose when working outside and some post-nasal drainage. She does report some white mucous production. She does use anoro as needed but denies relief when used for coughing.  ? ?She has a new 36 month old granddaughter. ? ?OV 02/16/21 ?She continues to have persistent cough with cough spells along with intermittent wheezing and shortness of breath. She did not tolerate the breo ellipta as this made her voice increasingly hoarse, so this was stopped. She reports it did help with her breathing a bit. She did start omeprazole '40mg'$  daily with some improvement in her cough. She is sleeping with the head of the bed elevated as well. She has cut back on her evening wine. ? ?She is accompanied by her daughter. They are concerned that the cough remains an issue and that it has  been ongoing for many months now with no significant improvement.  ?  ?OV 01/07/21 ?She has had a cough since August 2021 where she was also experiencing shortness of breath and wheezing. The cough was productive at that time and has since become dry. She was treated initially with antibiotics and steroids with some improvement but the cough persisted and she has subsequently been treated with other rounds of antibiotics and prednisone with out resolution of the cough. She does experience chest pressure along with the cough. She denies any limitations to her daily functioning from dyspnea. Albuterol has provided some relief of the cough when used. She denies any sinus congestion or drainage. She has been prescribed PPI therapy for GERD but has not started these medications. She has elevated the head of her bed over the past year without improvement in the cough. The cough does wake her up about once a week. She reports having a hoarse voice after talking for a while.  ? ?She grew up around second hand smoke as her mother was a smoker and had lung cancer.   ? ?Past Medical History:  ?Diagnosis Date  ? Allergy   ? RAD - PRN ALB MDI   ? Anxiety   ? Arrhythmia   ? history of SVT status post ablation in 2000   ? Chest pain 11/26/2014  ? Chronic headache   ? Depression   ? no meds  ? Fibrocystic  disease of breast   ? Hemorrhoids   ? HSV (herpes simplex virus) infection   ? outbreaks  ? IBS (irritable bowel syndrome)   ? Dr.Robert Vertell Limber in [place] 2006 colon and EGD normal  CT 2011  ? Osteopenia   ? BMD- 3/13  ? Ovarian cyst   ? left followed by Dr Sela Hilding   ? Palpitations 11/26/2014  ? c/o HR slow and fast, last 2-3 weeks. Had electrical ablation in 2001  ? Shoulder pain 11/26/2014  ? Skin melanoma ([place])   ? on back superficial only, Dr Allyson Sabal  ? Uterine fibroid   ? CT scan 1/11  ?  ? ?Family History  ?Problem Relation Age of Onset  ? Cancer Maternal Aunt   ?     breast  ? Cancer Maternal Aunt   ?     breast  ? Cancer  Mother   ?     lung  ? Heart Problems Father   ?     MVR and MVP  ? Cancer Father   ?     skin  ? Cancer Maternal Grandmother   ?     breast  ? Pulmonary embolism Paternal Grandfather   ? Cancer Daughter   ?     skin  ? Breast cancer Neg Hx   ?  ? ?Social History  ? ?Socioeconomic History  ? Marital status: Married  ?  Spouse name: Not on file  ? Number of children: Not on file  ? Years of education: Not on file  ? Highest education level: Not on file  ?Occupational History  ? Not on file  ?Tobacco Use  ? Smoking status: Never  ? Smokeless tobacco: Never  ?Vaping Use  ? Vaping Use: Never used  ?Substance and Sexual Activity  ? Alcohol use: Yes  ?  Alcohol/week: 0.0 standard drinks  ?  Comment: Heavy in the past. Wine 2 glasses occasionally   ? Drug use: Not on file  ? Sexual activity: Not on file  ?Other Topics Concern  ? Not on file  ?Social History Narrative  ? Tobacco use cigarettes: Never smoked   ? No smoking  ? No Tobacco Exposure  ? Alcohol : yes Heavy in the past: wine 2 glasses   ? Caffeine: yes  ? No recreational drug use  ? No diet  ? No exercise  ? Occupation : employed work at [employer] , in [employer] in [place],  [place]  ? Martial Status  : Married   ? Children girls 1  ? Seat belt use :yes  ? ?Social Determinants of Health  ? ?Financial Resource Strain: Not on file  ?Food Insecurity: Not on file  ?Transportation Needs: Not on file  ?Physical Activity: Not on file  ?Stress: Not on file  ?Social Connections: Not on file  ?Intimate Partner Violence: Not on file  ?  ? ?Allergies  ?Allergen Reactions  ? Penicillins Hives, Swelling and Rash  ?  Has patient had a PCN reaction causing immediate rash, facial/tongue/throat swelling, SOB or lightheadedness with hypotension: Yes ?Has patient had a PCN reaction causing severe rash involving mucus membranes or skin necrosis: Yes ?Has patient had a PCN reaction that required hospitalization: Yes ?Has patient had a PCN reaction occurring within the  last 10 years: No ?If all of the above answers are "NO", then may proceed with Cephalosporin use. ? ?  ? Peroxyl [Hydrogen Peroxide-Benzy Alc] Swelling and Rash  ?  Severe facial swelling, bumps   ? Benzoyl Peroxide   ? Fosamax [Alendronate] Other (See Comments)  ?  HA  ? Levaquin [Levofloxacin] Other (See Comments)  ?  Caused her tendons in her legs to cramp  ? Lopressor [Metoprolol Tartrate] Other (See Comments)  ?  fatigue  ? Sulfa Antibiotics Rash  ?  rash  ?  ? ?Outpatient Medications Prior to Visit  ?Medication Sig Dispense Refill  ? escitalopram (LEXAPRO) 10 MG tablet Take 10 mg by mouth daily.    ? fluticasone (FLONASE) 50 MCG/ACT nasal spray Place 1 spray into both nostrils daily as needed for allergies.     ? metoprolol succinate (TOPROL-XL) 25 MG 24 hr tablet TAKE 1 TABLET BY MOUTH DAILY. PLEASE MAKE OVERDUE APPT WITH DR. Lovena Le BEFORE ANYMORE REFILLS 90 tablet 3  ? NONFORMULARY OR COMPOUNDED ITEM [facility]:  Antifungal topical - Terbinafine 3%, Fluconazole 2%, Tea Tree Oil 5%, Ibuprofen 2%, in DMSO suspension #33m. Apply to affected toenail(s) once at bedtime or twice daily. 30 each 11  ? nystatin-triamcinolone (MYCOLOG II) cream nystatin-triamcinolone 100,000 unit/g-0.1 % topical cream ? APPLY TO THE AFFECTED AREA(S) BY TOPICAL ROUTE 2 TIMES PER DAY IN THEMORNING AND EVENING    ? omeprazole (PRILOSEC) 40 MG capsule Take 1 capsule (40 mg total) by mouth daily as needed. 90 capsule 3  ? Probiotic Product (PROBIOTIC PO) Take 1 tablet by mouth daily as needed (supplement).    ? rosuvastatin (CRESTOR) 10 MG tablet 1 tablet    ? terbinafine (LAMISIL) 250 MG tablet Take 250 mg by mouth daily.    ? VENTOLIN HFA 108 (90 BASE) MCG/ACT inhaler Inhale 2 puffs into the lungs every 4 (four) hours as needed. Shortness of breath or wheezing    ? metoprolol tartrate (LOPRESSOR) 50 MG tablet Take 1 tablet (50 mg total) by mouth daily as needed (Do not take more than 1 tablet in 24 hours). Please call to  schedule appt with Dr TLovena Le30 tablet 3  ? ?No facility-administered medications prior to visit.  ? ? ?Review of Systems  ?Constitutional:  Negative for chills, diaphoresis, fever, malaise/fatigue and weight

## 2022-04-14 NOTE — Patient Instructions (Addendum)
Your CT scan shows stable lung nodules. No further CT Chest scan needed.  ? ?Continue anoro ellipta 1 puff daily as needed ? ?Try flovent inhaler 2 puffs twice daily ?- rinse mouth out after each use ?- if you notice worsening in your voice, then stop this inhaler ? ?Continue zyrtec daily during allergy season ? ?Continue to elevate the head of the bed at night for reflux. ? ?Follow up in 1 year ? ?  ? ? ?

## 2022-04-15 DIAGNOSIS — K589 Irritable bowel syndrome without diarrhea: Secondary | ICD-10-CM | POA: Diagnosis not present

## 2022-04-15 DIAGNOSIS — Z86010 Personal history of colonic polyps: Secondary | ICD-10-CM | POA: Diagnosis not present

## 2022-05-04 DIAGNOSIS — D485 Neoplasm of uncertain behavior of skin: Secondary | ICD-10-CM | POA: Diagnosis not present

## 2022-05-04 DIAGNOSIS — L821 Other seborrheic keratosis: Secondary | ICD-10-CM | POA: Diagnosis not present

## 2022-05-04 DIAGNOSIS — D225 Melanocytic nevi of trunk: Secondary | ICD-10-CM | POA: Diagnosis not present

## 2022-05-04 DIAGNOSIS — Z08 Encounter for follow-up examination after completed treatment for malignant neoplasm: Secondary | ICD-10-CM | POA: Diagnosis not present

## 2022-05-04 DIAGNOSIS — Z86006 Personal history of melanoma in-situ: Secondary | ICD-10-CM | POA: Diagnosis not present

## 2022-05-04 DIAGNOSIS — L814 Other melanin hyperpigmentation: Secondary | ICD-10-CM | POA: Diagnosis not present

## 2022-05-28 DIAGNOSIS — R911 Solitary pulmonary nodule: Secondary | ICD-10-CM | POA: Diagnosis not present

## 2022-05-28 DIAGNOSIS — Z139 Encounter for screening, unspecified: Secondary | ICD-10-CM | POA: Diagnosis not present

## 2022-05-28 DIAGNOSIS — I471 Supraventricular tachycardia: Secondary | ICD-10-CM | POA: Diagnosis not present

## 2022-05-28 DIAGNOSIS — Z79899 Other long term (current) drug therapy: Secondary | ICD-10-CM | POA: Diagnosis not present

## 2022-05-28 DIAGNOSIS — I700 Atherosclerosis of aorta: Secondary | ICD-10-CM | POA: Diagnosis not present

## 2022-05-28 DIAGNOSIS — E7800 Pure hypercholesterolemia, unspecified: Secondary | ICD-10-CM | POA: Diagnosis not present

## 2022-06-21 DIAGNOSIS — S50869A Insect bite (nonvenomous) of unspecified forearm, initial encounter: Secondary | ICD-10-CM | POA: Diagnosis not present

## 2022-06-21 DIAGNOSIS — W57XXXA Bitten or stung by nonvenomous insect and other nonvenomous arthropods, initial encounter: Secondary | ICD-10-CM | POA: Diagnosis not present

## 2022-11-01 ENCOUNTER — Encounter: Payer: Self-pay | Admitting: Internal Medicine

## 2022-11-01 ENCOUNTER — Telehealth: Payer: Self-pay | Admitting: Internal Medicine

## 2022-11-01 NOTE — Telephone Encounter (Addendum)
Pt called re needing directions on how to send EKG from phone to mychart Attempted to give directions Pt will try again to send Also pt c/o chest tightness for about a month Appt made with Sharon July PA for 12/01/22 at 11:20 am Pt will call back if unable to send recordings to office ./cy

## 2022-11-01 NOTE — Telephone Encounter (Signed)
Pt states her EKG is giving her an unusual reading and wants to know what to do.

## 2022-11-11 DIAGNOSIS — J069 Acute upper respiratory infection, unspecified: Secondary | ICD-10-CM | POA: Diagnosis not present

## 2022-11-23 NOTE — Progress Notes (Signed)
PCP:  Leonides Sake, MD Primary Cardiologist: None Electrophysiologist: Cristopher Peru, MD   Sharon Potter is a 66 y.o. female seen today for Cristopher Peru, MD for acute visit due to chest discomfort and increased palpitations for the past month.   She has recorded episodes on her Jodelle Red which demonstrate HRs in the 140-160s. These are usually accompanied by tachy-palpitations, and at times, a mild chest "pressure".  She has had some tingling in her left arm after these episodes as well. No recent changes to her medications. She drinks 1-2 glasses of wine daily. Not drinking much water. Has 2 cups coffee, 1 cup tea daily as well. No syncope, no exertional chest pain. Symptoms can come on at rest.   Past Medical History:  Diagnosis Date   Allergy    RAD - PRN ALB MDI    Anxiety    Arrhythmia    history of SVT status post ablation in 2000    Chest pain 11/26/2014   Chronic headache    Depression    no meds   Fibrocystic disease of breast    Hemorrhoids    HSV (herpes simplex virus) infection    outbreaks   IBS (irritable bowel syndrome)    Dr.Robert Vertell Limber in [place] 2006 colon and EGD normal  CT 2011   Osteopenia    BMD- 3/13   Ovarian cyst    left followed by Dr Sela Hilding    Palpitations 11/26/2014   c/o HR slow and fast, last 2-3 weeks. Had electrical ablation in 2001   Shoulder pain 11/26/2014   Skin melanoma ([place])    on back superficial only, Dr Allyson Sabal   Uterine fibroid    CT scan 1/11   Past Surgical History:  Procedure Laterality Date   ABLATION  2000 OR 2001   NASAL SEPTUM SURGERY     repair    Current Outpatient Medications  Medication Sig Dispense Refill   escitalopram (LEXAPRO) 10 MG tablet Take 10 mg by mouth daily.     fluticasone (FLONASE) 50 MCG/ACT nasal spray Place 1 spray into both nostrils daily as needed for allergies.      fluticasone (FLOVENT HFA) 110 MCG/ACT inhaler Inhale 2 puffs into the lungs 2 (two) times daily. 1 each 12   metoprolol  succinate (TOPROL-XL) 25 MG 24 hr tablet TAKE 1 TABLET BY MOUTH DAILY. PLEASE MAKE OVERDUE APPT WITH DR. Lovena Le BEFORE ANYMORE REFILLS 90 tablet 3   metoprolol tartrate (LOPRESSOR) 50 MG tablet Take 1 tablet (50 mg total) by mouth daily as needed (Do not take more than 1 tablet in 24 hours). Please call to schedule appt with Dr Lovena Le 30 tablet 3   NONFORMULARY OR COMPOUNDED ITEM [facility]:  Antifungal topical - Terbinafine 3%, Fluconazole 2%, Tea Tree Oil 5%, Ibuprofen 2%, in DMSO suspension #21m. Apply to affected toenail(s) once at bedtime or twice daily. 30 each 11   nystatin-triamcinolone (MYCOLOG II) cream nystatin-triamcinolone 100,000 unit/g-0.1 % topical cream  APPLY TO THE AFFECTED AREA(S) BY TOPICAL ROUTE 2 TIMES PER DAY IN THEMORNING AND EVENING     omeprazole (PRILOSEC) 40 MG capsule Take 1 capsule (40 mg total) by mouth daily as needed. 90 capsule 3   Probiotic Product (PROBIOTIC PO) Take 1 tablet by mouth daily as needed (supplement).     rosuvastatin (CRESTOR) 10 MG tablet 1 tablet     terbinafine (LAMISIL) 250 MG tablet Take 250 mg by mouth daily.     umeclidinium-vilanterol (ANORO ELLIPTA)  62.5-25 MCG/ACT AEPB Inhale 1 puff into the lungs daily. 60 each 5   VENTOLIN HFA 108 (90 BASE) MCG/ACT inhaler Inhale 2 puffs into the lungs every 4 (four) hours as needed. Shortness of breath or wheezing     No current facility-administered medications for this visit.    Allergies  Allergen Reactions   Penicillins Hives, Swelling and Rash    Has patient had a PCN reaction causing immediate rash, facial/tongue/throat swelling, SOB or lightheadedness with hypotension: Yes Has patient had a PCN reaction causing severe rash involving mucus membranes or skin necrosis: Yes Has patient had a PCN reaction that required hospitalization: Yes Has patient had a PCN reaction occurring within the last 10 years: No If all of the above answers are "NO", then may proceed with Cephalosporin  use.     Peroxyl [Hydrogen Peroxide-Benzy Alc] Swelling and Rash    Severe facial swelling, bumps    Benzoyl Peroxide    Fosamax [Alendronate] Other (See Comments)    HA   Levaquin [Levofloxacin] Other (See Comments)    Caused her tendons in her legs to cramp   Lopressor [Metoprolol Tartrate] Other (See Comments)    fatigue   Sulfa Antibiotics Rash    rash    Social History   Socioeconomic History   Marital status: Married    Spouse name: Not on file   Number of children: Not on file   Years of education: Not on file   Highest education level: Not on file  Occupational History   Not on file  Tobacco Use   Smoking status: Never   Smokeless tobacco: Never  Vaping Use   Vaping Use: Never used  Substance and Sexual Activity   Alcohol use: Yes    Alcohol/week: 0.0 standard drinks of alcohol    Comment: Heavy in the past. Wine 2 glasses occasionally    Drug use: Not on file   Sexual activity: Not on file  Other Topics Concern   Not on file  Social History Narrative   Tobacco use cigarettes: Never smoked    No smoking   No Tobacco Exposure   Alcohol : yes Heavy in the past: wine 2 glasses    Caffeine: yes   No recreational drug use   No diet   No exercise   Occupation : employed work at [employer] , in [employer] in [place],  [place] Status  : Married    Children girls 1   Seat belt use :yes   Social Determinants of Radio broadcast assistant Strain: Not on file  Food Insecurity: Not on file  Transportation Needs: Not on file  Physical Activity: Not on file  Stress: Not on file  Social Connections: Not on file  Intimate Partner Violence: Not on file     Review of Systems: All other systems reviewed and are otherwise negative except as noted above.  Physical Exam: Vitals:   12/01/22 1150  BP: (!) 120/90  Pulse: 61  SpO2: 95%  Weight: 129 lb 9.6 oz (58.8 kg)  Height: '5\' 3"'$  (1.6 m)    GEN- The patient is well appearing,  alert and oriented x 3 today.   HEENT: normocephalic, atraumatic; sclera clear, conjunctiva pink; hearing intact; oropharynx clear; neck supple, no JVP Lymph- no cervical lymphadenopathy Lungs- Clear to ausculation bilaterally, normal work of breathing.  No wheezes, rales, rhonchi Heart- Regular rate and rhythm, no murmurs, rubs or gallops, PMI not laterally displaced GI-  soft, non-tender, non-distended, bowel sounds present, no hepatosplenomegaly Extremities- No peripheral edema. no clubbing or cyanosis; DP/PT/radial pulses 2+ bilaterally MS- no significant deformity or atrophy Skin- warm and dry, no rash or lesion Psych- euthymic mood, full affect Neuro- strength and sensation are intact  EKG is ordered. Personal review of EKG from today shows NSR at 61 bpm, no ischemic changes compared to prior  Additional studies reviewed include: Previous EP notes.   Assessment and Plan:  1. Chest discomfort 2. SVT S/p ablation > 20 years ago in 2000. 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  2 week monitor to clarify and quantify.  Update Echo AFTER.  Increase toprol to 50 mg daily to see if this helps with symptoms.  If chest discomfort becomes exertion or occurs outside of her tachy-palpitations, consider ischemic eval (most likely Myoview or CT Cors)  3. HLD Continue crestor  4. Claudication Very mild   Follow up with Dr. Lovena Le in  6 weeks as scheduled after Echo and monitor.    Shirley Friar, PA-C  11/23/22 2:05 PM

## 2022-11-29 NOTE — Telephone Encounter (Signed)
Pt was able to send EKG via mychart ./cy

## 2022-12-01 ENCOUNTER — Ambulatory Visit (INDEPENDENT_AMBULATORY_CARE_PROVIDER_SITE_OTHER): Payer: Medicare Other

## 2022-12-01 ENCOUNTER — Encounter: Payer: Self-pay | Admitting: Student

## 2022-12-01 ENCOUNTER — Ambulatory Visit: Payer: Medicare Other | Attending: Student | Admitting: Student

## 2022-12-01 VITALS — BP 120/90 | HR 61 | Ht 63.0 in | Wt 129.6 lb

## 2022-12-01 DIAGNOSIS — I471 Supraventricular tachycardia, unspecified: Secondary | ICD-10-CM

## 2022-12-01 DIAGNOSIS — R0789 Other chest pain: Secondary | ICD-10-CM | POA: Diagnosis not present

## 2022-12-01 MED ORDER — METOPROLOL SUCCINATE ER 50 MG PO TB24: 50.0000 mg | ORAL_TABLET | Freq: Every day | ORAL | 3 refills | Status: DC

## 2022-12-01 NOTE — Patient Instructions (Addendum)
Medication Instructions:  Your physician has recommended you make the following change in your medication:   INCREASE: Metoprolol to '50mg'$  daily at bedtime  *If you need a refill on your cardiac medications before your next appointment, please call your pharmacy*   Lab Work: None If you have labs (blood work) drawn today and your tests are completely normal, you will receive your results only by: [facility] (if you have MyChart) OR A paper copy in the mail If you have any lab test that is abnormal or we need to change your treatment, we will call you to review the results.   Testing/Procedures: Your physician has requested that you have an echocardiogram AFTER WEARING 2 WEEK MONITOR. Echocardiography is a painless test that uses sound waves to create images of your heart. It provides your doctor with information about the size and shape of your heart and how well your heart's chambers and valves are working. This procedure takes approximately one hour. There are no restrictions for this procedure. Please do NOT wear cologne, perfume, aftershave, or lotions (deodorant is allowed). Please arrive 15 minutes prior to your appointment time.   Follow-Up: At [facility], you and your health needs are our priority.  As part of our continuing mission to provide you with exceptional heart care, we have created designated Provider Care Teams.  These Care Teams include your primary Cardiologist (physician) and Advanced Practice Providers (APPs -  Physician Assistants and Nurse Practitioners) who all work together to provide you with the care you need, when you need it.   Your next appointment:   4-6 week(s)  The format for your next appointment:   In Person  Provider:   Cristopher Peru, MD   Other Instructions Bryn Gulling- Long Term Monitor Instructions  Your physician has requested you wear a ZIO patch monitor for 14 days.  This is a single patch monitor. [employer] supplies one patch  monitor per enrollment. Additional stickers are not available. Please do not apply patch if you will be having a Nuclear Stress Test,  Echocardiogram, Cardiac CT, MRI, or Chest Xray during the period you would be wearing the  monitor. The patch cannot be worn during these tests. You cannot remove and re-apply the  ZIO XT patch monitor.  Your ZIO patch monitor will be mailed 3 day USPS to your address on file. It may take 3-5 days  to receive your monitor after you have been enrolled.  Once you have received your monitor, please review the enclosed instructions. Your monitor  has already been registered assigning a specific monitor serial # to you.  Billing and Patient Assistance Program Information  We have supplied [employer] with any of your insurance information on file for billing purposes. [employer] offers a sliding scale Patient Assistance Program for patients that do not have  insurance, or whose insurance does not completely cover the cost of the ZIO monitor.  You must apply for the Patient Assistance Program to qualify for this discounted rate.  To apply, please call [employer] at [phone], select option 4, select option 2, ask to apply for  Patient Assistance Program. Theodore Demark will ask your household income, and how many people  are in your household. They will quote your out-of-pocket cost based on that information.  [employer] will also be able to set up a 48-month interest-free payment plan if needed.  Applying the monitor   Shave hair from upper left chest.  Hold abrader disc by orange tab. Rub abrader in  40 strokes over the upper left chest as  indicated in your monitor instructions.  Clean area with 4 enclosed alcohol pads. Let dry.  Apply patch as indicated in monitor instructions. Patch will be placed under collarbone on left  side of chest with arrow pointing upward.  Rub patch adhesive wings for 2 minutes. Remove white label marked "1". Remove the white  label marked "2".  Rub patch adhesive wings for 2 additional minutes.  While looking in a mirror, press and release button in center of patch. A small green light will  flash 3-4 times. This will be your only indicator that the monitor has been turned on.  Do not shower for the first 24 hours. You may shower after the first 24 hours.  Press the button if you feel a symptom. You will hear a small click. Record Date, Time and  Symptom in the Patient Logbook.  When you are ready to remove the patch, follow instructions on the last 2 pages of Patient  Logbook. Stick patch monitor onto the last page of Patient Logbook.  Place Patient Logbook in the blue and white box. Use locking tab on box and tape box closed  securely. The blue and white box has prepaid postage on it. Please place it in the mailbox as  soon as possible. Your physician should have your test results approximately 7 days after the  monitor has been mailed back to [facility].  Call [facility] at [phone] if you have questions regarding  your ZIO XT patch monitor. Call them immediately if you see an orange light blinking on your  monitor.  If your monitor falls off in less than 4 days, contact our Monitor department at [phone].  If your monitor becomes loose or falls off after 4 days call [employer] at [phone] for  suggestions on securing your monitor

## 2022-12-01 NOTE — Progress Notes (Unsigned)
Enrolled patient for a 14 day Zio XT monitor to be mailed to patients home   Dr Lovena Le to read

## 2022-12-02 DIAGNOSIS — Z79899 Other long term (current) drug therapy: Secondary | ICD-10-CM | POA: Diagnosis not present

## 2022-12-02 DIAGNOSIS — I471 Supraventricular tachycardia, unspecified: Secondary | ICD-10-CM | POA: Diagnosis not present

## 2022-12-02 DIAGNOSIS — I700 Atherosclerosis of aorta: Secondary | ICD-10-CM | POA: Diagnosis not present

## 2022-12-02 DIAGNOSIS — E7800 Pure hypercholesterolemia, unspecified: Secondary | ICD-10-CM | POA: Diagnosis not present

## 2022-12-04 DIAGNOSIS — I471 Supraventricular tachycardia, unspecified: Secondary | ICD-10-CM | POA: Diagnosis not present

## 2022-12-25 DIAGNOSIS — R509 Fever, unspecified: Secondary | ICD-10-CM | POA: Diagnosis not present

## 2022-12-25 DIAGNOSIS — Z20822 Contact with and (suspected) exposure to covid-19: Secondary | ICD-10-CM | POA: Diagnosis not present

## 2022-12-25 DIAGNOSIS — J40 Bronchitis, not specified as acute or chronic: Secondary | ICD-10-CM | POA: Diagnosis not present

## 2022-12-25 DIAGNOSIS — R059 Cough, unspecified: Secondary | ICD-10-CM | POA: Diagnosis not present

## 2022-12-29 ENCOUNTER — Telehealth: Payer: Self-pay | Admitting: Internal Medicine

## 2022-12-29 NOTE — Telephone Encounter (Signed)
Left message for pt to call back to discuss her concerns.

## 2022-12-29 NOTE — Telephone Encounter (Signed)
Attempted to call patient. Unable to leave voicemail.  

## 2022-12-29 NOTE — Telephone Encounter (Signed)
Patient was returning call. Please advise ?

## 2022-12-29 NOTE — Telephone Encounter (Signed)
Patient is calling to see if prednisone will effect her echocardiogram she has on Friday. Please advise

## 2022-12-30 NOTE — Telephone Encounter (Signed)
Pt called back and told that ok to continue taking her prednisone as ordered by her provider.  Pt just wanted to double check prior to testing.    Pt stated will also complete the pre-procedure questionnaire.

## 2022-12-31 ENCOUNTER — Ambulatory Visit (HOSPITAL_COMMUNITY): Payer: Medicare Other | Attending: Student

## 2022-12-31 DIAGNOSIS — I471 Supraventricular tachycardia, unspecified: Secondary | ICD-10-CM | POA: Diagnosis not present

## 2022-12-31 DIAGNOSIS — R0789 Other chest pain: Secondary | ICD-10-CM | POA: Diagnosis not present

## 2022-12-31 LAB — ECHOCARDIOGRAM COMPLETE
Area-P 1/2: 4.02 cm2
S' Lateral: 2.8 cm

## 2023-01-13 ENCOUNTER — Ambulatory Visit: Payer: Medicare Other | Attending: Internal Medicine | Admitting: Internal Medicine

## 2023-01-13 VITALS — BP 130/80 | HR 64 | Resp 20 | Wt 127.0 lb

## 2023-01-13 DIAGNOSIS — I471 Supraventricular tachycardia, unspecified: Secondary | ICD-10-CM

## 2023-01-13 DIAGNOSIS — R002 Palpitations: Secondary | ICD-10-CM | POA: Diagnosis not present

## 2023-01-13 NOTE — Progress Notes (Signed)
HPI Sharon Potter returns today for followup. She underwent EP study and catheter ablation over 20 years ago. She has had rare episodes of palpitations over the years and most recently begun to have more with documented SVT at 160/min. The patient has not had syncope but gets sob when she goes into tachycardia. She denies excess caffeine intake.  Allergies  Allergen Reactions   Penicillins Hives, Swelling and Rash    Has patient had a PCN reaction causing immediate rash, facial/tongue/throat swelling, SOB or lightheadedness with hypotension: Yes Has patient had a PCN reaction causing severe rash involving mucus membranes or skin necrosis: Yes Has patient had a PCN reaction that required hospitalization: Yes Has patient had a PCN reaction occurring within the last 10 years: No If all of the above answers are "NO", then may proceed with Cephalosporin use.     Peroxyl [Hydrogen Peroxide-Benzy Alc] Swelling and Rash    Severe facial swelling, bumps    Benzoyl Peroxide    Fosamax [Alendronate] Other (See Comments)    HA   Levaquin [Levofloxacin] Other (See Comments)    Caused her tendons in her legs to cramp   Lopressor [Metoprolol Tartrate] Other (See Comments)    fatigue   Sulfa Antibiotics Rash    rash     Current Outpatient Medications  Medication Sig Dispense Refill   fluticasone (FLONASE) 50 MCG/ACT nasal spray Place 1 spray into both nostrils daily as needed for allergies.      fluticasone (FLOVENT HFA) 110 MCG/ACT inhaler Inhale 2 puffs into the lungs 2 (two) times daily. 1 each 12   metoprolol succinate (TOPROL-XL) 50 MG 24 hr tablet Take 1 tablet (50 mg total) by mouth at bedtime. 90 tablet 3   NONFORMULARY OR COMPOUNDED ITEM [facility]:  Antifungal topical - Terbinafine 3%, Fluconazole 2%, Tea Tree Oil 5%, Ibuprofen 2%, in DMSO suspension #93m. Apply to affected toenail(s) once at bedtime or twice daily. 30 each 11   omeprazole (PRILOSEC) 40 MG capsule Take 1  capsule (40 mg total) by mouth daily as needed. 90 capsule 3   Probiotic Product (PROBIOTIC PO) Take 1 tablet by mouth daily as needed (supplement).     rosuvastatin (CRESTOR) 10 MG tablet 1 tablet     umeclidinium-vilanterol (ANORO ELLIPTA) 62.5-25 MCG/ACT AEPB Inhale 1 puff into the lungs daily. 60 each 5   VENTOLIN HFA 108 (90 BASE) MCG/ACT inhaler Inhale 2 puffs into the lungs every 4 (four) hours as needed. Shortness of breath or wheezing     No current facility-administered medications for this visit.     Past Medical History:  Diagnosis Date   Allergy    RAD - PRN ALB MDI    Anxiety    Arrhythmia    history of SVT status post ablation in 2000    Chest pain 11/26/2014   Chronic headache    Depression    no meds   Fibrocystic disease of breast    Hemorrhoids    HSV (herpes simplex virus) infection    outbreaks   IBS (irritable bowel syndrome)    [id number] and EGD normal  CT 2011   Osteopenia    BMD- 3/13   Ovarian cyst    left followed by Dr MSela Hilding   Palpitations 11/26/2014   c/o HR slow and fast, last 2-3 weeks. Had electrical ablation in 2001   Shoulder pain 11/26/2014   [place], Dr Allyson Sabal   Uterine fibroid    CT scan 1/11    ROS:   All systems reviewed and negative except as noted in the HPI.   Past Surgical History:  Procedure Laterality Date   ABLATION  2000 OR 2001   NASAL SEPTUM SURGERY     repair     Family History  Problem Relation Age of Onset   Cancer Maternal Aunt        breast   Cancer Maternal Aunt        breast   Cancer Mother        lung   Heart Problems Father        MVR and MVP   Cancer Father        skin   Cancer Maternal Grandmother        breast   Pulmonary embolism Paternal Grandfather    Cancer Daughter        skin   Breast cancer Neg Hx      Social History   Socioeconomic History   Marital status: Married    Spouse name: Not on file    Number of children: Not on file   Years of education: Not on file   Highest education level: Not on file  Occupational History   Not on file  Tobacco Use   Smoking status: Never   Smokeless tobacco: Never  Vaping Use   Vaping Use: Never used  Substance and Sexual Activity   Alcohol use: Yes    Alcohol/week: 0.0 standard drinks of alcohol    Comment: Heavy in the past. Wine 2 glasses occasionally    Drug use: Not on file   Sexual activity: Not on file  Other Topics Concern   Not on file  Social History Narrative   Tobacco use cigarettes: Never smoked    No smoking   No Tobacco Exposure   Alcohol : yes Heavy in the past: wine 2 glasses    Caffeine: yes   No recreational drug use   No diet   No exercise   Occupation : employed work at [employer] , in [employer] in [place],  [place] Status  : Married    Children girls 1   Seat belt use :yes   Social Determinants of Radio broadcast assistant Strain: Not on file  Food Insecurity: Not on file  Transportation Needs: Not on file  Physical Activity: Not on file  Stress: Not on file  Social Connections: Not on file  Intimate Partner Violence: Not on file     BP 130/80 Comment: LT ARM  Pulse 64   Resp 20   Wt 127 lb 0.6 oz (57.6 kg)   SpO2 97%   BMI 22.50 kg/m   Physical Exam:  Well appearing 67 yo woman, NAD HEENT: Unremarkable Neck:  No JVD, no thyromegally Lymphatics:  No adenopathy Back:  No CVA tenderness Lungs:  Clear with no wheezes HEART:  Regular rate rhythm, no murmurs, no rubs, no clicks Abd:  soft, positive bowel sounds, no organomegally, no rebound, no guarding Ext:  2 plus pulses, no edema, no cyanosis, no clubbing Skin:  No rashes no nodules Neuro:  CN II through XII intact, motor grossly intact  EKG - nsr  Cardiac monitor - SVT at 160/min  Assess/Plan: SVT - I have discussed the treatment options with the patient in detail. EPs and catheter ablation vs medical  therapy  were both reviewe. Ablation would carry and increased risk of heart block and I have explained this to the patient. She will call us and for now she will continue with toprol.  Carleene Overlie Nansi [place],MD

## 2023-01-13 NOTE — Patient Instructions (Addendum)
Medication Instructions:  Your physician recommends that you continue on your current medications as directed. Please refer to the Current Medication list given to you today.  *If you need a refill on your cardiac medications before your next appointment, please call your pharmacy*  Lab Work: None ordered.  If you have labs (blood work) drawn today and your tests are completely normal, you will receive your results only by: [place] (if you have MyChart) OR A paper copy in the mail If you have any lab test that is abnormal or we need to change your treatment, we will call you to review the results.  Testing/Procedures: None ordered.  Follow-Up: Dr. Cristopher Peru stated Patient will continue taking medications.  IF she wants to proceed with an SVT Ablation, ok to scheduled, No dates provided.  Dr. Lovena Le wants (+) Carto, (-) NO Anesthesia.    Cardiac Ablation Cardiac ablation is a procedure to destroy, or ablate, a small amount of heart tissue that is causing problems. The heart has many electrical connections. Sometimes, these connections are abnormal and can cause the heart to beat very fast or irregularly. Ablating the abnormal areas can improve the heart's rhythm or return it to normal. Ablation may be done for people who: Have irregular or rapid heartbeats (arrhythmias). Have Wolff-Parkinson-White syndrome. Have taken medicines for an arrhythmia that did not work or caused side effects. Have a high-risk heartbeat that may be life-threatening. Tell a health care provider about: Any allergies you have. All medicines you are taking, including vitamins, herbs, eye drops, creams, and over-the-counter medicines. Any problems you or family members have had with anesthesia. Any bleeding problems you have. Any surgeries you have had. Any medical conditions you have. Whether you are pregnant or may be pregnant. What are the risks? Your health care provider will talk with you about  risks. These may include: Infection. Bruising and bleeding. Stroke or blood clots. Damage to nearby structures or organs. Allergic reaction to medicines or dyes. Needing a pacemaker if the heart gets damaged. A pacemaker is a device that helps the heart beat normally. Failure of the procedure. A repeat procedure may be needed. What happens before the procedure? Medicines Ask your health care provider about: Changing or stopping your regular medicines. These include any heart rhythm medicines, diabetes medicines, or blood thinners you take. Taking medicines such as aspirin and ibuprofen. These medicines can thin your blood. Do not take them unless your health care provider tells you to. Taking over-the-counter medicines, vitamins, herbs, and supplements. General instructions Follow instructions from your health care provider about what you may eat and drink. If you will be going home right after the procedure, plan to have a responsible adult: Take you home from the hospital or clinic. You will not be allowed to drive. Care for you for the time you are told. Ask your health care provider what steps will be taken to prevent infection. What happens during the procedure?  An IV will be inserted into one of your veins. You may be given: A sedative. This helps you relax. Anesthesia. This will: Numb certain areas of your body. An incision will be made in your neck or your groin. A needle will be inserted through the incision and into a large vein in your neck or groin. The small, thin tube (catheter) will be inserted through the needle and moved to your heart. A type of X-ray (fluoroscopy) will be used to help guide the catheter and provide images of the  heart on a monitor. Dye may be injected through the catheter to help your surgeon see the area of the heart that needs treatment. Electrical currents will be sent from the catheter to destroy heart tissue in certain areas. There are three  types of energy that may be used to do this: Heat (radiofrequency energy). Laser energy. Extreme cold (cryoablation). When the tissue has been destroyed, the catheter will be removed. Pressure will be held on the insertion area to prevent bleeding. A bandage (dressing) will be placed over the insertion area. The procedure may vary among health care providers and hospitals. What happens after the procedure? Your blood pressure, heart rate and rhythm, breathing rate, and blood oxygen level will be monitored until you leave the hospital or clinic. Your insertion area will be checked for bleeding. You will need to lie still for a few hours. If your groin was used, you will need to keep your leg straight for a few hours after the catheter is removed. This information is not intended to replace advice given to you by your health care provider. Make sure you discuss any questions you have with your health care provider. Document Revised: 06/01/2022 Document Reviewed: 06/01/2022 Elsevier Patient Education  [place].

## 2023-03-03 ENCOUNTER — Other Ambulatory Visit: Payer: Self-pay | Admitting: Obstetrics & Gynecology

## 2023-03-03 ENCOUNTER — Ambulatory Visit
Admission: RE | Admit: 2023-03-03 | Discharge: 2023-03-03 | Disposition: A | Discharge status: Home / Self Care | Payer: Medicare Other | Source: Ambulatory Visit | Attending: Obstetrics & Gynecology | Admitting: Obstetrics & Gynecology

## 2023-03-03 DIAGNOSIS — Z1231 Encounter for screening mammogram for malignant neoplasm of breast: Secondary | ICD-10-CM | POA: Diagnosis not present

## 2023-03-07 ENCOUNTER — Ambulatory Visit: Payer: Medicare Other | Admitting: Podiatry

## 2023-03-07 DIAGNOSIS — B351 Tinea unguium: Secondary | ICD-10-CM | POA: Diagnosis not present

## 2023-03-07 DIAGNOSIS — Z79899 Other long term (current) drug therapy: Secondary | ICD-10-CM | POA: Diagnosis not present

## 2023-03-07 NOTE — Progress Notes (Signed)
Subjective:   Patient ID: Sharon Potter, female   DOB: 67 y.o.   MRN: [id number]   HPI Chief Complaint  Patient presents with   Nail Problem    Right foot nail fungus on going for years patient states she is interested in possible laser treatments    67 year old female presents Today for concerns.  She states that she is interested in treatments for nail fungus of the nails are still somewhat discolored and thickened.  She states that the toes likely cause discomfort at times but currently no pain.  No swelling or redness or drainage.        Objective:  Physical Exam  General: AAO x3, NAD  Dermatological: Nails are mildly hypertrophic, dystrophic with yellow discoloration.  There is no edema, erythema or signs of infection of the toenail sites.  There are no open lesions.  Vascular: Dorsalis Pedis artery and Posterior Tibial artery pedal pulses are 2/4 bilateral with immedate capillary fill time.  There is no pain with calf compression, swelling, warmth, erythema.   Neruologic: Grossly intact via light touch bilateral.   Musculoskeletal: No significant pain on exam.  Gait: Unassisted, Nonantalgic.       Assessment:   Onychomycosis     Plan:  -Treatment options discussed including all alternatives, risks, and complications -Etiology of symptoms were discussed -Discussed treatment options for nail fungus including oral, topical as well as alternative treatments.  At this time the patient wants to proceed with oral Lamisil.  We discussed side effects of the medication and success rates.  We will check a CBC and LFT prior to starting the medication.  Once I receive the results of this I will then call the medication in.  -Also order compound cream through [facility] for nail fungus -Discussed laser if she wants to proceed with this we can schedule her as well.  Trula Slade DPM

## 2023-03-07 NOTE — Patient Instructions (Signed)
I have ordered a medication for you that will come from [facility] in [place]. They should be calling you to verify insurance and will mail the medication to you. If you live close by then you can go by their pharmacy to pick up the medication. Their phone number is [phone]. If you do not hear from them in the next few days, please give [place] a call at [phone].   --    Terbinafine Tablets What is this medication? TERBINAFINE (TER bin a feen) treats fungal infections of the nails. It belongs to a group of medications called antifungals. It will not treat infections caused by bacteria or viruses. This medicine may be used for other purposes; ask your health care provider or pharmacist if you have questions. COMMON BRAND NAME(S): Lamisil, Terbinex What should I tell my care team before I take this medication? They need to know if you have any of these conditions: Liver disease An unusual or allergic reaction to terbinafine, other medications, foods, dyes, or preservatives Pregnant or trying to get pregnant Breast-feeding How should I use this medication? Take this medication by mouth with water. Take it as directed on the prescription label at the same time every day. You can take it with or without food. If it upsets your stomach, take it with food. Keep taking it unless your care team tells you to stop. A special MedGuide will be given to you by the pharmacist with each prescription and refill. Be sure to read this information carefully each time. Talk to your care team regarding the use of this medication in children. Special care may be needed. Overdosage: If you think you have taken too much of this medicine contact a poison control center or emergency room at once. NOTE: This medicine is only for you. Do not share this medicine with others. What if I miss a dose? If you miss a dose, take it as soon as you can unless it is more than 4 hours late. If it is more than 4  hours late, skip the missed dose. Take the next dose at the normal time. What may interact with this medication? Do not take this medication with any of the following: Pimozide Thioridazine This medication may also interact with the following: Beta blockers Caffeine Certain medications for mental health conditions Cimetidine Cyclosporine Medications for fungal infections like fluconazole and ketoconazole Medications for irregular heartbeat like amiodarone, flecainide and propafenone Rifampin Warfarin This list may not describe all possible interactions. Give your health care provider a list of all the medicines, herbs, non-prescription drugs, or dietary supplements you use. Also tell them if you smoke, drink alcohol, or use illegal drugs. Some items may interact with your medicine. What should I watch for while using this medication? Visit your care team for regular checks on your progress. You may need blood work while you are taking this medication. It may be some time before you see the benefit from this medication. This medication may cause serious skin reactions. They can happen weeks to months after starting the medication. Contact your care team right away if you notice fevers or flu-like symptoms with a rash. The rash may be red or purple and then turn into blisters or peeling of the skin. Or, you might notice a red rash with swelling of the face, lips or lymph nodes in your neck or under your arms. This medication can make you more sensitive to the sun. Keep out of the sun, If you cannot avoid  being in the sun, wear protective clothing and sunscreen. Do not use sun lamps or tanning beds/booths. What side effects may I notice from receiving this medication? Side effects that you should report to your care team as soon as possible: Allergic reactions--skin rash, itching, hives, swelling of the face, lips, tongue, or throat Change in sense of smell Change in taste Infection--fever,  chills, cough, or sore throat Liver injury--right upper belly pain, loss of appetite, nausea, light-colored stool, dark yellow or brown urine, yellowing skin or eyes, unusual weakness or fatigue Low red blood cell level--unusual weakness or fatigue, dizziness, headache, trouble breathing Lupus-like syndrome--joint pain, swelling, or stiffness, butterfly-shaped rash on the face, rashes that get worse in the sun, fever, unusual weakness or fatigue Rash, fever, and swollen lymph nodes Redness, blistering, peeling, or loosening of the skin, including inside the mouth Unusual bruising or bleeding Worsening mood, feelings of depression Side effects that usually do not require medical attention (report to your care team if they continue or are bothersome): Diarrhea Gas Headache Nausea Stomach pain Upset stomach This list may not describe all possible side effects. Call your doctor for medical advice about side effects. You may report side effects to FDA at 1-800-FDA-1088. Where should I keep my medication? Keep out of the reach of children and pets. Store between 20 and 25 degrees C (68 and 77 degrees F). Protect from light. Get rid of any unused medication after the expiration date. To get rid of medications that are no longer needed or have expired: Take the medication to a medication take-back program. Check with your pharmacy or law enforcement to find a location. If you cannot return the medication, check the label or package insert to see if the medication should be thrown out in the garbage or flushed down the toilet. If you are not sure, ask your care team. If it is safe to put it in the trash, take the medication out of the container. Mix the medication with cat litter, dirt, coffee grounds, or other unwanted substance. Seal the mixture in a bag or container. Put it in the trash. NOTE: This sheet is a summary. It may not cover all possible information. If you have questions about this  medicine, talk to your doctor, pharmacist, or health care provider.  2023 Elsevier/Gold Standard (2021-07-07 00:00:00)

## 2023-03-08 LAB — CBC WITH DIFFERENTIAL/PLATELET
Basophils Absolute: 0.1 10*3/uL (ref 0.0–0.2)
Basos: 1 %
EOS (ABSOLUTE): 0.2 10*3/uL (ref 0.0–0.4)
Eos: 4 %
Hematocrit: 40.9 % (ref 34.0–46.6)
Hemoglobin: 14 g/dL (ref 11.1–15.9)
Immature Grans (Abs): 0 10*3/uL (ref 0.0–0.1)
Immature Granulocytes: 0 %
Lymphocytes Absolute: 1.7 10*3/uL (ref 0.7–3.1)
Lymphs: 38 %
MCH: 30.6 pg (ref 26.6–33.0)
MCHC: 34.2 g/dL (ref 31.5–35.7)
MCV: 89 fL (ref 79–97)
Monocytes Absolute: 0.4 10*3/uL (ref 0.1–0.9)
Monocytes: 9 %
Neutrophils Absolute: 2.1 10*3/uL (ref 1.4–7.0)
Neutrophils: 48 %
Platelets: 217 10*3/uL (ref 150–450)
RBC: 4.58 x10E6/uL (ref 3.77–5.28)
RDW: 13.7 % (ref 11.7–15.4)
WBC: 4.5 10*3/uL (ref 3.4–10.8)

## 2023-03-08 LAB — HEPATIC FUNCTION PANEL
ALT: 13 IU/L (ref 0–32)
AST: 18 IU/L (ref 0–40)
Albumin: 4.8 g/dL (ref 3.9–4.9)
Alkaline Phosphatase: 60 IU/L (ref 44–121)
Bilirubin Total: 1.1 mg/dL (ref 0.0–1.2)
Bilirubin, Direct: 0.3 mg/dL (ref 0.00–0.40)
Total Protein: 7 g/dL (ref 6.0–8.5)

## 2023-03-10 ENCOUNTER — Other Ambulatory Visit: Payer: Self-pay | Admitting: Podiatry

## 2023-03-10 DIAGNOSIS — Z79899 Other long term (current) drug therapy: Secondary | ICD-10-CM

## 2023-03-10 DIAGNOSIS — Z6823 Body mass index (BMI) 23.0-23.9, adult: Secondary | ICD-10-CM | POA: Diagnosis not present

## 2023-03-10 MED ORDER — TERBINAFINE HCL 250 MG PO TABS: 250.0000 mg | ORAL_TABLET | Freq: Every day | ORAL | 0 refills | Status: DC

## 2023-05-10 DIAGNOSIS — L814 Other melanin hyperpigmentation: Secondary | ICD-10-CM | POA: Diagnosis not present

## 2023-05-10 DIAGNOSIS — D225 Melanocytic nevi of trunk: Secondary | ICD-10-CM | POA: Diagnosis not present

## 2023-05-10 DIAGNOSIS — Z86006 Personal history of melanoma in-situ: Secondary | ICD-10-CM | POA: Diagnosis not present

## 2023-05-10 DIAGNOSIS — L821 Other seborrheic keratosis: Secondary | ICD-10-CM | POA: Diagnosis not present

## 2023-05-10 DIAGNOSIS — Z08 Encounter for follow-up examination after completed treatment for malignant neoplasm: Secondary | ICD-10-CM | POA: Diagnosis not present

## 2023-08-04 DIAGNOSIS — I471 Supraventricular tachycardia, unspecified: Secondary | ICD-10-CM | POA: Diagnosis not present

## 2023-08-04 DIAGNOSIS — Z139 Encounter for screening, unspecified: Secondary | ICD-10-CM | POA: Diagnosis not present

## 2023-08-04 DIAGNOSIS — Z9181 History of falling: Secondary | ICD-10-CM | POA: Diagnosis not present

## 2023-08-04 DIAGNOSIS — I700 Atherosclerosis of aorta: Secondary | ICD-10-CM | POA: Diagnosis not present

## 2023-08-04 DIAGNOSIS — F1090 Alcohol use, unspecified, uncomplicated: Secondary | ICD-10-CM | POA: Diagnosis not present

## 2023-08-04 DIAGNOSIS — E7800 Pure hypercholesterolemia, unspecified: Secondary | ICD-10-CM | POA: Diagnosis not present

## 2023-08-04 DIAGNOSIS — Z79899 Other long term (current) drug therapy: Secondary | ICD-10-CM | POA: Diagnosis not present

## 2023-10-18 DIAGNOSIS — Z23 Encounter for immunization: Secondary | ICD-10-CM | POA: Diagnosis not present

## 2023-12-03 DIAGNOSIS — J209 Acute bronchitis, unspecified: Secondary | ICD-10-CM | POA: Diagnosis not present

## 2023-12-03 DIAGNOSIS — Z20822 Contact with and (suspected) exposure to covid-19: Secondary | ICD-10-CM | POA: Diagnosis not present

## 2023-12-14 DIAGNOSIS — L578 Other skin changes due to chronic exposure to nonionizing radiation: Secondary | ICD-10-CM | POA: Diagnosis not present

## 2023-12-14 DIAGNOSIS — D0462 Carcinoma in situ of skin of left upper limb, including shoulder: Secondary | ICD-10-CM | POA: Diagnosis not present

## 2023-12-14 DIAGNOSIS — L814 Other melanin hyperpigmentation: Secondary | ICD-10-CM | POA: Diagnosis not present

## 2023-12-14 DIAGNOSIS — D485 Neoplasm of uncertain behavior of skin: Secondary | ICD-10-CM | POA: Diagnosis not present

## 2024-01-19 ENCOUNTER — Other Ambulatory Visit: Payer: Self-pay | Admitting: Obstetrics & Gynecology

## 2024-01-19 DIAGNOSIS — Z1231 Encounter for screening mammogram for malignant neoplasm of breast: Secondary | ICD-10-CM

## 2024-01-24 DIAGNOSIS — L820 Inflamed seborrheic keratosis: Secondary | ICD-10-CM | POA: Diagnosis not present

## 2024-01-24 DIAGNOSIS — L821 Other seborrheic keratosis: Secondary | ICD-10-CM | POA: Diagnosis not present

## 2024-01-24 DIAGNOSIS — L538 Other specified erythematous conditions: Secondary | ICD-10-CM | POA: Diagnosis not present

## 2024-01-24 DIAGNOSIS — D0462 Carcinoma in situ of skin of left upper limb, including shoulder: Secondary | ICD-10-CM | POA: Diagnosis not present

## 2024-03-05 ENCOUNTER — Ambulatory Visit
Admission: RE | Admit: 2024-03-05 | Discharge: 2024-03-05 | Disposition: A | Discharge status: Home / Self Care | Payer: Medicare Other | Source: Ambulatory Visit | Attending: Obstetrics & Gynecology | Admitting: Obstetrics & Gynecology

## 2024-03-05 DIAGNOSIS — Z1231 Encounter for screening mammogram for malignant neoplasm of breast: Secondary | ICD-10-CM | POA: Diagnosis not present

## 2024-03-08 DIAGNOSIS — E7800 Pure hypercholesterolemia, unspecified: Secondary | ICD-10-CM | POA: Diagnosis not present

## 2024-03-08 DIAGNOSIS — D649 Anemia, unspecified: Secondary | ICD-10-CM | POA: Diagnosis not present

## 2024-03-08 DIAGNOSIS — I471 Supraventricular tachycardia, unspecified: Secondary | ICD-10-CM | POA: Diagnosis not present

## 2024-03-08 DIAGNOSIS — Z79899 Other long term (current) drug therapy: Secondary | ICD-10-CM | POA: Diagnosis not present

## 2024-03-08 DIAGNOSIS — Z23 Encounter for immunization: Secondary | ICD-10-CM | POA: Diagnosis not present

## 2024-03-15 ENCOUNTER — Encounter: Payer: Self-pay | Admitting: Pulmonary Disease

## 2024-03-23 DIAGNOSIS — Z09 Encounter for follow-up examination after completed treatment for conditions other than malignant neoplasm: Secondary | ICD-10-CM | POA: Diagnosis not present

## 2024-03-23 DIAGNOSIS — Z86007 Personal history of in-situ neoplasm of skin: Secondary | ICD-10-CM | POA: Diagnosis not present

## 2024-03-23 DIAGNOSIS — Z08 Encounter for follow-up examination after completed treatment for malignant neoplasm: Secondary | ICD-10-CM | POA: Diagnosis not present

## 2024-03-23 DIAGNOSIS — L578 Other skin changes due to chronic exposure to nonionizing radiation: Secondary | ICD-10-CM | POA: Diagnosis not present

## 2024-03-23 DIAGNOSIS — L814 Other melanin hyperpigmentation: Secondary | ICD-10-CM | POA: Diagnosis not present

## 2024-03-23 DIAGNOSIS — L821 Other seborrheic keratosis: Secondary | ICD-10-CM | POA: Diagnosis not present

## 2024-04-05 ENCOUNTER — Other Ambulatory Visit: Payer: Self-pay | Admitting: Student

## 2024-04-05 DIAGNOSIS — I471 Supraventricular tachycardia, unspecified: Secondary | ICD-10-CM

## 2024-04-23 DIAGNOSIS — M816 Localized osteoporosis [Lequesne]: Secondary | ICD-10-CM | POA: Diagnosis not present

## 2024-04-23 DIAGNOSIS — Z6823 Body mass index (BMI) 23.0-23.9, adult: Secondary | ICD-10-CM | POA: Diagnosis not present

## 2024-05-01 ENCOUNTER — Other Ambulatory Visit: Payer: Self-pay | Admitting: Internal Medicine

## 2024-05-01 DIAGNOSIS — I471 Supraventricular tachycardia, unspecified: Secondary | ICD-10-CM

## 2024-05-09 DIAGNOSIS — Z872 Personal history of diseases of the skin and subcutaneous tissue: Secondary | ICD-10-CM | POA: Diagnosis not present

## 2024-05-09 DIAGNOSIS — L538 Other specified erythematous conditions: Secondary | ICD-10-CM | POA: Diagnosis not present

## 2024-05-09 DIAGNOSIS — D235 Other benign neoplasm of skin of trunk: Secondary | ICD-10-CM | POA: Diagnosis not present

## 2024-05-09 DIAGNOSIS — L821 Other seborrheic keratosis: Secondary | ICD-10-CM | POA: Diagnosis not present

## 2024-05-09 DIAGNOSIS — Z08 Encounter for follow-up examination after completed treatment for malignant neoplasm: Secondary | ICD-10-CM | POA: Diagnosis not present

## 2024-05-09 DIAGNOSIS — Z86006 Personal history of melanoma in-situ: Secondary | ICD-10-CM | POA: Diagnosis not present

## 2024-05-09 DIAGNOSIS — L2989 Other pruritus: Secondary | ICD-10-CM | POA: Diagnosis not present

## 2024-05-09 DIAGNOSIS — Z85828 Personal history of other malignant neoplasm of skin: Secondary | ICD-10-CM | POA: Diagnosis not present

## 2024-05-09 DIAGNOSIS — Z85820 Personal history of malignant melanoma of skin: Secondary | ICD-10-CM | POA: Diagnosis not present

## 2024-05-09 DIAGNOSIS — L814 Other melanin hyperpigmentation: Secondary | ICD-10-CM | POA: Diagnosis not present

## 2024-05-09 DIAGNOSIS — L820 Inflamed seborrheic keratosis: Secondary | ICD-10-CM | POA: Diagnosis not present

## 2024-05-11 ENCOUNTER — Other Ambulatory Visit: Payer: Self-pay | Admitting: Internal Medicine

## 2024-05-11 DIAGNOSIS — I471 Supraventricular tachycardia, unspecified: Secondary | ICD-10-CM

## 2024-05-16 NOTE — Progress Notes (Signed)
 I, Miquel Amen, CMA acting as a scribe for Garlan Juniper, MD.  Sharon Potter is a 68 y.o. female who presents to [facility] today for osteoporosis management. Family hx of osteoporosis, breast cancer, ovarian cancer, and blood clots/DVT.  DEXA scan (date, T-score): 04/23/24: Spine= -1.8, L-FN= -2.1, R-FN= -2.9 Prior treatment: no History of Hip, Spine, or Wrist Fx: none Heart disease or stroke: no - tachycardia Cancer: skin cancer, basal, squamous, and melanoma Kidney Disease: no Gastric/Peptic Ulcer: no - IBS Gastric bypass surgery: no Severe GERD: yes - not on meds Hx of seizures: no Age at Menopause: 68 y/o Calcium intake: not supplementing - consumes dairy Vitamin D intake: no Hormone replacement therapy: no Smoking history: never smoked Alcohol: yes - Merlot nightly Exercise: yes - farm work Major dental work in past year: no Parents with hip/spine fracture: no Height loss: yes: 63" to 62.5"  Left shoulder / upper arm pain. Pain in the fingers. Toes/feet cramp up.    Pertinent review of systems: No fevers or chills  Relevant historical information: SVT.  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   Exam:  BP 102/70   Pulse (!) 57   Ht 5\' 3"  (1.6 m)   Wt 128 lb (58.1 kg)   SpO2 99%   BMI 22.67 kg/m  General: Well Developed, well nourished, and in no acute distress.   MSK: Left shoulder normal-appearing normal motion. Intact strength. Negative Hawkins and positive Neer's test.  Positive crossover arm compression test.       Assessment and Plan: 68 y.o. female with osteoporosis.  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  He has been a fair mount of time talking about her options.  Will go ahead and check basic labs  including vitamin D to make sure those are optimized.  Additionally recommend seeing if she can get a gym membership through [employer] and start some resistance training.  After reviewing her medication options she thinks that oral bisphosphonates are probably her best option which are very reasonable.  Will prescribe Fosamax.  Of note she does have a listed allergy to Fosamax in the chart causing headaches.  She has no recollection of ever taking Fosamax so I think it safe to try.  If she has side effects or problems with this medicine it is easy to stop and switch to something else.  Typical course of treatment for bisphosphonates is 5 years.  PDMP not reviewed this encounter. Orders Placed This Encounter  Procedures   Comprehensive metabolic panel with GFR    Osteoporis    Standing Status:   Future    Number of Occurrences:   1    Expiration Date:   05/17/2025   Magnesium    Therapeutic drug monitoring    Standing Status:   Future    Number of Occurrences:   1    Expiration Date:   05/17/2025   Phosphorus    Osteroporis    Standing Status:   Future    Number of Occurrences:   1    Expiration Date:   05/17/2025   VITAMIN D 25 Hydroxy (Vit-D Deficiency, Fractures)    Osteoporosis    Standing Status:   Future    Number of Occurrences:  1    Expiration Date:   05/17/2025   Meds ordered this encounter  Medications   alendronate (FOSAMAX) 70 MG tablet    Sig: Take 1 tablet (70 mg total) by mouth once a week. Take with a full glass of water on an empty stomach.    Dispense:  12 tablet    Refill:  3     Discussed warning signs or symptoms. Please see discharge instructions. Patient expresses understanding.   The above documentation has been reviewed and is accurate and complete Garlan Juniper, M.D.

## 2024-05-17 ENCOUNTER — Encounter: Payer: Self-pay | Admitting: Family Medicine

## 2024-05-17 ENCOUNTER — Ambulatory Visit: Admitting: Family Medicine

## 2024-05-17 VITALS — BP 102/70 | HR 57 | Ht 63.0 in | Wt 128.0 lb

## 2024-05-17 DIAGNOSIS — M810 Age-related osteoporosis without current pathological fracture: Secondary | ICD-10-CM | POA: Diagnosis not present

## 2024-05-17 LAB — COMPREHENSIVE METABOLIC PANEL WITH GFR
ALT: 12 U/L (ref 0–35)
AST: 18 U/L (ref 0–37)
Albumin: 4.8 g/dL (ref 3.5–5.2)
Alkaline Phosphatase: 46 U/L (ref 39–117)
BUN: 19 mg/dL (ref 6–23)
CO2: 29 meq/L (ref 19–32)
Calcium: 9.3 mg/dL (ref 8.4–10.5)
Chloride: 105 meq/L (ref 96–112)
Creatinine, Ser: 0.86 mg/dL (ref 0.40–1.20)
GFR: 69.4 mL/min (ref >60.00)
Glucose, Bld: 99 mg/dL (ref 70–99)
Potassium: 4.1 meq/L (ref 3.5–5.1)
Sodium: 140 meq/L (ref 135–145)
Total Bilirubin: 1.5 mg/dL — ABNORMAL HIGH (ref 0.2–1.2)
Total Protein: 7.1 g/dL (ref 6.0–8.3)

## 2024-05-17 LAB — PHOSPHORUS: Phosphorus: 2.9 mg/dL (ref 2.3–4.6)

## 2024-05-17 LAB — VITAMIN D 25 HYDROXY (VIT D DEFICIENCY, FRACTURES): VITD: 32.77 ng/mL (ref 30.00–100.00)

## 2024-05-17 LAB — MAGNESIUM: Magnesium: 2 mg/dL (ref 1.5–2.5)

## 2024-05-17 MED ORDER — ALENDRONATE SODIUM 70 MG PO TABS: 70.0000 mg | ORAL_TABLET | ORAL | 3 refills | Status: DC

## 2024-05-17 NOTE — Patient Instructions (Signed)
 Thank you for coming in today.   Start Fosamax weekly.  Stop by lab before you leave.   Look in to [employer], working with a Runner, broadcasting/film/video.   See you back in 1 year, sooner if needed.

## 2024-05-18 ENCOUNTER — Telehealth: Payer: Self-pay | Admitting: Family Medicine

## 2024-05-18 ENCOUNTER — Ambulatory Visit: Payer: Self-pay | Admitting: Family Medicine

## 2024-05-18 DIAGNOSIS — R17 Unspecified jaundice: Secondary | ICD-10-CM

## 2024-05-18 NOTE — Telephone Encounter (Signed)
 I called Tannah back and have ordered a hepatic function panel and an fractionated bilirubin to be checked in about a month.

## 2024-05-18 NOTE — Telephone Encounter (Signed)
 Patient called with concerns about her recent labs and the high total bilirubin level. She asked if someone could call her back to discuss.

## 2024-05-18 NOTE — Progress Notes (Signed)
 Labs look okay.  Continue current vitamin D.

## 2024-06-07 ENCOUNTER — Ambulatory Visit: Admitting: Podiatry

## 2024-06-07 VITALS — HR 72

## 2024-06-07 DIAGNOSIS — B351 Tinea unguium: Secondary | ICD-10-CM | POA: Diagnosis not present

## 2024-06-07 MED ORDER — JUBLIA 10 % EX SOLN: 1.0000 | Freq: Every day | CUTANEOUS | 2 refills | Status: DC

## 2024-06-07 NOTE — Progress Notes (Signed)
 Subjective:   Patient ID: Sharon Potter, female   DOB: 68 y.o.   MRN: [id number]   HPI Chief Complaint  Patient presents with   Nail Problem    Rm 11 Patient is here for fungal treatment for right and left feet. Patient states high pulse rate and feeling light headed. Patient is using a topical treatment for toe nails on a daily basis (Fungi Nails and prescription TERB3%/FLU2%TEA Tree 5%). Patient may be interested in laser treatment.    68 year old female presents today for the above concerns.  She has been using a topical prescription for her nails without much improvement.  She is interested in other treatments.  Of note she states her HR went up a lot this morning, which is not new. Upon leaving the office she states she felt much better and her symptoms resolved. No chest pain, SOB.       Objective:  Physical Exam  General: AAO x3, NAD  Dermatological: Nails are mildly hypertrophic, dystrophic with yellow discoloration.  There is no edema, erythema or signs of infection of the toenail sites.  There are no open lesions or signs of infection today.   Vascular: Dorsalis Pedis artery and Posterior Tibial artery pedal pulses are 2/4 bilateral with immedate capillary fill time.  There is no pain with calf compression, swelling, warmth, erythema.   Neruologic: Grossly intact via light touch bilateral.   Musculoskeletal: No significant pain on exam.  Gait: Unassisted, Nonantalgic.       Assessment:   Onychomycosis     Plan:  -Treatment options discussed including all alternatives, risks, and complications -Etiology of symptoms were discussed -Discussed treatment options for nail fungus including oral, topical as well as alternative treatments.  She does not want to proceed with oral medication.  Order Jublia  for her.  We discussed adding laser treatment as well close assess rates, number of treatments as well as cause.    Return if symptoms worsen or fail to improve, for laser  treatment if you want to proceed.  Charity Conch DPM

## 2024-06-07 NOTE — Patient Instructions (Addendum)
 Efinaconazole Topical solution What is this medication? EFINACONAZOLE (e FEE na KON a zole) treats fungal infections of the nails. It belongs to a group of medications called antifungals. It will not treat infections caused by bacteria or viruses. This medicine may be used for other purposes; ask your health care provider or pharmacist if you have questions. COMMON BRAND NAME(S): JUBLIA What should I tell my care team before I take this medication? They need to know if you have any of these conditions: An unusual or allergic reaction to efinaconazole, other medications, foods, dyes or preservatives Pregnant or trying to get pregnant Breast-feeding How should I use this medication? This medication is for external use only. Do not take by mouth. Wash your hands before and after use. Do not get it in your eyes. If you do, rinse your eyes with plenty of cool tap water. Use it as directed on the prescription label. Do not use it more often than directed. Use the medication for the full course as directed by your care team, even if you think you are better. Do not stop using it unless your care team tells you to stop it early. This medication comes with INSTRUCTIONS FOR USE. Ask your pharmacist for directions on how to use this medication. Read the information carefully. Talk to your pharmacist or care team if you have questions. Talk to your care team about the use of this medication in children. While it may be prescribed for children as young as 6 years for selected conditions, precautions do apply. Overdosage: If you think you have taken too much of this medicine contact a poison control center or emergency room at once. NOTE: This medicine is only for you. Do not share this medicine with others. What if I miss a dose? If you miss a dose, use it as soon as you can. If it is almost time for your next dose, use only that dose. Do not use double or extra doses. What may interact with this  medication? Interactions are not expected. Do not use any other skin products on the same area of skin without talking to your care team. This list may not describe all possible interactions. Give your health care provider a list of all the medicines, herbs, non-prescription drugs, or dietary supplements you use. Also tell them if you smoke, drink alcohol, or use illegal drugs. Some items may interact with your medicine. What should I watch for while using this medication? Visit your care team for regular checks on your progress. It may be some time before you see the benefit from this medication. Do not use nail polish or other nail cosmetic products on the treated nails. What side effects may I notice from receiving this medication? Side effects that you should report to your care team as soon as possible: Allergic reactions--skin rash, itching, hives, swelling of the face, lips, tongue, or throat Side effects that usually do not require medical attention (report to your care team if they continue or are bothersome): Ingrown nails Mild skin irritation, redness, or dryness This list may not describe all possible side effects. Call your doctor for medical advice about side effects. You may report side effects to FDA at 1-800-FDA-1088. Where should I keep my medication? Keep out of the reach of children and pets. Store at room temperature between 20 and 25 degrees C (68 and 77 degrees F). Do not freeze. Keep the container tightly closed. Get rid of any unused medication after the expiration date.  This medication is flammable. Avoid exposure to heat, flame, and smoking. To get rid of medications that are no longer needed or have expired: Take the medication to a medication take-back program. Check with your pharmacy or law enforcement to find a location. If you cannot return the medication, ask your pharmacist or care team how to get rid of this medication safely. NOTE: This sheet is a summary. It  may not cover all possible information. If you have questions about this medicine, talk to your doctor, pharmacist, or health care provider.  2024 Elsevier/Gold Standard (2022-02-22 00:00:00)

## 2024-06-11 ENCOUNTER — Other Ambulatory Visit: Payer: Self-pay

## 2024-06-11 MED ORDER — ROSUVASTATIN CALCIUM 10 MG PO TABS: 10.0000 mg | ORAL_TABLET | Freq: Every day | ORAL | 0 refills | Status: AC

## 2024-06-13 ENCOUNTER — Other Ambulatory Visit (HOSPITAL_COMMUNITY): Payer: Self-pay

## 2024-06-13 ENCOUNTER — Other Ambulatory Visit: Payer: Self-pay

## 2024-06-13 DIAGNOSIS — I471 Supraventricular tachycardia, unspecified: Secondary | ICD-10-CM

## 2024-06-13 MED ORDER — METOPROLOL SUCCINATE ER 50 MG PO TB24
50.0000 mg | ORAL_TABLET | Freq: Every day | ORAL | 0 refills | Status: DC
  Filled 2024-06-13: qty 30, 30d supply, fill #0

## 2024-06-25 ENCOUNTER — Other Ambulatory Visit (HOSPITAL_COMMUNITY): Payer: Self-pay

## 2024-06-26 ENCOUNTER — Other Ambulatory Visit: Payer: Self-pay | Admitting: Primary Care

## 2024-06-26 ENCOUNTER — Encounter: Payer: Self-pay | Admitting: Primary Care

## 2024-06-26 ENCOUNTER — Ambulatory Visit: Admitting: Primary Care

## 2024-06-26 VITALS — BP 114/66 | HR 57 | Temp 97.8°F | Ht 62.5 in | Wt 130.8 lb

## 2024-06-26 DIAGNOSIS — Z7722 Contact with and (suspected) exposure to environmental tobacco smoke (acute) (chronic): Secondary | ICD-10-CM | POA: Diagnosis not present

## 2024-06-26 DIAGNOSIS — J4520 Mild intermittent asthma, uncomplicated: Secondary | ICD-10-CM | POA: Diagnosis not present

## 2024-06-26 DIAGNOSIS — R911 Solitary pulmonary nodule: Secondary | ICD-10-CM | POA: Diagnosis not present

## 2024-06-26 MED ORDER — UMECLIDINIUM-VILANTEROL 62.5-25 MCG/ACT IN AEPB: 1.0000 | INHALATION_SPRAY | Freq: Every day | RESPIRATORY_TRACT | 11 refills | Status: AC

## 2024-06-26 MED ORDER — FAMOTIDINE 20 MG PO TABS: 20.0000 mg | ORAL_TABLET | Freq: Every day | ORAL | 3 refills | Status: DC

## 2024-06-26 MED ORDER — FLUTICASONE PROPIONATE HFA 110 MCG/ACT IN AERO: 2.0000 | INHALATION_SPRAY | Freq: Two times a day (BID) | RESPIRATORY_TRACT | 11 refills | Status: DC

## 2024-06-26 NOTE — Patient Instructions (Addendum)
 -CHRONIC COUGH WITH REACTIVE AIRWAY DISEASE: Your chronic cough, which has been present since August 2021, is likely due to reactive airway disease, a condition where your airways overreact to certain triggers. We will refill your Anoro and fluticasone  inhalers and restart omeprazole  or try Pepcid at bedtime to manage reflux. Use Flovent  daily until your cough is controlled, then as needed. Remember to rinse your mouth after using Flovent  to prevent thrush. If Anoro is too expensive, use Flovent  alone and notify us  via MyChart.  -POTENTIAL POST-COVID PULMONARY EFFECTS: If you had COVID-19, it could have long-term effects on your lungs, such as scarring or reactive airway disease. Although you do not have a confirmed COVID diagnosis, we will keep this in mind as we manage your symptoms.  -PULMONARY NODULE: A small pulmonary nodule was found in your right lower lung on a CT scan from 2023. This is a small growth that needs to be monitored to ensure it does not change in size. We will order a follow-up CT scan to keep an eye on it.   INSTRUCTIONS: Please follow up with a CT chest scan to monitor the pulmonary nodule. Use your inhalers as prescribed and restart omeprazole  or try Pepcid at bedtime. Avoid exposure to secondhand smoke as much as possible. If you have any issues with the cost of Anoro, use Flovent  alone and notify us  via MyChart.   Follow-up 6 months with Dr. Kara or sooner if needed    Fluticasone  Metered Dose Inhaler (MDI) What is this medication? FLUTICASONE  (floo TIK a sone) prevents the symptoms of asthma. It works by decreasing inflammation in the airways, making it easier to breathe. It belongs to a group of medications called inhaled steroids. It is often called a controller inhaler. Do not use it to treat a sudden asthma attack. This medicine may be used for other purposes; ask your health care provider or pharmacist if you have questions. COMMON BRAND NAME(S): Flovent ,  Flovent  HFA What should I tell my care team before I take this medication? They need to know if you have any of these conditions: Eye disease Immune system problems Infection, especially a viral infection, such as chickenpox, cold sores, herpes Injury of mouth or throat Osteoporosis, weak bones Receiving steroids, such as dexamethasone  or prednisone Recent surgery Vision problems An unusual or allergic reaction to fluticasone , other medications, foods, dyes, or preservatives Pregnant or trying to get pregnant Breastfeeding How should I use this medication? This medication is inhaled through the mouth. Rinse your mouth with water after use. Make sure not to swallow the water. Take it as directed on the prescription label. Do not use it more often than directed. Keep using it unless your care team tells you to stop. This medication comes with INSTRUCTIONS FOR USE. Ask your pharmacist for directions on how to use this medication. Read the information carefully. Talk to your pharmacist or care team if you have questions. Talk to your care team about the use of this medication in children. While it may be prescribed for children as young as 4 years for selected conditions, precautions do apply. Overdosage: If you think you have taken too much of this medicine contact a poison control center or emergency room at once. NOTE: This medicine is only for you. Do not share this medicine with others. What if I miss a dose? If you miss a dose, take it as soon as you can. If it is almost time for your next dose, take only that dose.  Do not take double or extra doses. What may interact with this medication? Certain antibiotics, such as clarithromycin, telithromycin Certain antivirals for HIV or hepatitis Certain medications for fungal infections, such as ketoconazole, itraconazole, posaconazole, voriconazole Nefazodone This list may not describe all possible interactions. Give your health care provider a  list of all the medicines, herbs, non-prescription drugs, or dietary supplements you use. Also tell them if you smoke, drink alcohol, or use illegal drugs. Some items may interact with your medicine. What should I watch for while using this medication? Visit your care team for regular checks on your progress. Tell them if your symptoms do not start to get better or if they get worse. Talk to your care team about how to treat an acute asthma attack or bronchospasm (wheezing). Be sure to always have a short-acting inhaler with you. If you use your short-acting inhaler and your symptoms do not get better or if they get worse, call your care team right away. You and your care team should develop an Asthma Action Plan that is just for you. Be sure to know what to do if you are in the yellow (asthma is getting worse) or red (medical alert) zones. This medication may increase your risk of getting an infection. Call your care team for advice if you get a fever, chills, or sore throat, or other symptoms of a cold or flu. Do not treat yourself. Try to avoid being around people who are sick. Using this medication for a long time may weaken your bones. The risk of bone fractures may be increased. Talk to your care team about your bone health. This medication may slow your child's growth if it is taken for a long time at high doses. Your child's care team will monitor your child's growth. This medication may cause cataracts or glaucoma, especially with long term use. You should have regular eye exams while taking this medication. Tell your care team if you notice changes in your eyesight. What side effects may I notice from receiving this medication? Side effects that you should report to your care team as soon as possible: Allergic reactions--skin rash, itching, hives, swelling of the face, lips, tongue, or throat Flu-like symptoms--fever, chills, muscle pain, cough, headache, fatigue Low adrenal gland  function--nausea, vomiting, loss of appetite, unusual weakness, fatigue, dizziness Pain, tingling, or numbness in the hands or feet Sinus pain or pressure around the face or forehead Thrush--white patches in the mouth Wheezing or trouble breathing that is worse after use Side effects that usually do not require medical attention (report to your care team if they continue or are bothersome): Change in taste Cough Diarrhea Headache Hoarseness Sore throat This list may not describe all possible side effects. Call your doctor for medical advice about side effects. You may report side effects to FDA at 1-800-FDA-1088. Where should I keep my medication? Keep out of the reach of children and pets. Store at room temperature between 20 and 25 degrees C (68 and 77 degrees F) with the mouthpiece down. Keep inhaler away from extreme heat. Get rid of this medication when the dose counter reads 0 or after the expiration date, whichever is first. To get rid of medications that are no longer needed or have expired: Take the medication to a medication take-back program. Check with your pharmacy or law enforcement to find a location. If you cannot return the medication, ask your care team how to get rid of this medication safely. NOTE: This sheet is  a summary. It may not cover all possible information. If you have questions about this medicine, talk to your doctor, pharmacist, or health care provider.  2024 Elsevier/Gold Standard (2022-07-08 00:00:00)   Umeclidinium; Vilanterol Dry Powder Inhaler (DPI) What is this medication? UMECLIDINIUM; VILANTEROL (ue MEK li DIN ee um; vye LAN ter ol) treats chronic obstructive pulmonary disease (COPD). It works by opening the airways of the lungs, making it easier to breathe. It is a combination of an anticholinergic and a bronchodilator. It is often called a controller inhaler. Do not use it to treat a sudden COPD flare-up. This medicine may be used for other  purposes; ask your health care provider or pharmacist if you have questions. COMMON BRAND NAME(S): ANORO ELLIPTA  What should I tell my care team before I take this medication? They need to know if you have any of these conditions: Diabetes Glaucoma Heart disease High blood pressure Irregular heartbeat or rhythm Kidney disease Pheochromocytoma Prostate disease Seizures Thyroid  disease An unusual or allergic reaction to umeclidinium, vilanterol, lactose, milk proteins, other medications, foods, dyes, or preservatives Pregnant or trying to get pregnant Breast-feeding How should I use this medication? This medication is inhaled through the mouth. Take it as directed on the prescription label at the same time every day. Do not use it more often than directed. Keep taking it unless your care team tells you to stop. This medication comes with INSTRUCTIONS FOR USE. Ask your pharmacist for directions on how to use this medication. Read the information carefully. Talk to your pharmacist or care team if you have questions. Talk to your care team about the use of this medication in children. Special care may be needed. Overdosage: If you think you have taken too much of this medicine contact a poison control center or emergency room at once. NOTE: This medicine is only for you. Do not share this medicine with others. What if I miss a dose? If you miss a dose, take it as soon as you can. If it is almost time for your next dose, take only that dose. Do not take double or extra doses. What may interact with this medication? Do not take this medication with any of the following: Cisapride Dofetilide Dronedarone MAOIs like Carbex, Eldepryl, Marplan, Nardil, and Parnate Pimozide Thioridazine Ziprasidone This medication may also interact with the following: Antihistamines for allergy Antiviral medications for HIV or AIDS Atropine Beta-blockers like metoprolol  and propranolol Certain medications  for bladder problems like oxybutynin, tolterodine Certain medications for depression, anxiety, or psychotic disturbances Certain medications for Parkinson disease like benztropine, trihexyphenidyl Certain medications for stomach problems like dicyclomine, hyoscyamine Certain medications for travel sickness like scopolamine Diuretics Ipratropium Medications for colds Medications for fungal infections like ketoconazole and itraconazole Other medications for breathing problems Other medications that prolong the QT interval (cause an abnormal heart rhythm) Tiotropium This list may not describe all possible interactions. Give your health care provider a list of all the medicines, herbs, non-prescription drugs, or dietary supplements you use. Also tell them if you smoke, drink alcohol, or use illegal drugs. Some items may interact with your medicine. What should I watch for while using this medication? Visit your care team for regular checks on your progress. Tell your care team if your symptoms do not start to get better or if they get worse. NEVER use this medication for an acute asthma attack. You should use your short-acting rescue inhaler for an acute attack. If your symptoms get worse or if you need  your short-acting inhalers more often, call your care team right away. This medication can worsen breathing or cause wheezing right after you use it. Be sure you have a short-acting inhaler for acute attacks (wheezing) nearby. If this happens, stop using this medication right away and call your care team. This medication may increase the risk of serious asthma-related problems. Talk your care team if you have questions. Do not treat yourself for coughs, colds or allergies without asking your care team for advice. Some nonprescription medications can affect this one. What side effects may I notice from receiving this medication? Side effects that you should report to your care team as soon as  possible: Allergic reactions--skin rash, itching, hives, swelling of the face, lips, tongue, or throat Heart rhythm changes--fast or irregular heartbeat, dizziness, feeling faint or lightheaded, chest pain, trouble breathing Increase in blood pressure Muscle pain or cramps Sudden eye pain or change in vision such as blurry vision, seeing halos around lights, vision loss Trouble passing urine Wheezing or trouble breathing that is worse after use Side effects that usually do not require medical attention (report to your care team if they continue or are bothersome): Constipation Cough Dry mouth Headache Runny or stuffy nose Sore throat Tremors or shaking Trouble sleeping This list may not describe all possible side effects. Call your doctor for medical advice about side effects. You may report side effects to FDA at 1-800-FDA-1088. Where should I keep my medication? Keep out of the reach of children and pets. Store at room temperature between 20 and 25 degrees C (68 and 77 degrees F). Protect from light. Keep inhaler away from extreme heat, cold or humidity. Get rid of it 6 weeks after removing it from the foil pouch, when the dose counter reads 0 or after the expiration date, whichever is first. To get rid of medication that are no longer needed or have expired: Take the medication to a medication take-back program. Check with your pharmacy or law enforcement to find a location. If you cannot return the medication, ask your pharmacist or care team how to get rid of this medication safely. NOTE: This sheet is a summary. It may not cover all possible information. If you have questions about this medicine, talk to your doctor, pharmacist, or health care provider.  2024 Elsevier/Gold Standard (2021-10-18 00:00:00)

## 2024-06-26 NOTE — Progress Notes (Signed)
 @Patient  ID: Sharon Potter, female    DOB: [birth year], 68 y.o.   MRN: [id number]  Chief Complaint  Patient presents with   Follow-up    Cough- dry, wheezing. Started 1.5 months ago. No triggers. Takes cough drops and drinks water. Cough is random, no specific times. Pt does cough a lot of night     Referring provider: Hamrick, Charlene LITTIE, MD  HPI: 68 year old female, never smoked. PMH significant for chronic cough. Patient of Dr. Kara.     Previous [facility] pulmonary encounter:  OV 12/15/21 Her cough is overall improved and more intermittent at this time. She does notice more coughing episodes at night. She has a humidifier and air purifier in her bedroom since last visit. She is also wearing a mask when working around her horses and animals to cut down on dust exposure.    She is not using omeprazole . She continues to sleep elevated. She is using flonase  PRN and does have runny nose when working outside and some post-nasal drainage. She does report some white mucous production. She does use anoro as needed but denies relief when used for coughing.    She has a new 85 month old granddaughter.   OV 02/16/21 She continues to have persistent cough with cough spells along with intermittent wheezing and shortness of breath. She did not tolerate the breo ellipta as this made her voice increasingly hoarse, so this was stopped. She reports it did help with her breathing a bit. She did start omeprazole  40mg  daily with some improvement in her cough. She is sleeping with the head of the bed elevated as well. She has cut back on her evening wine.   She is accompanied by her daughter. They are concerned that the cough remains an issue and that it has been ongoing for many months now with no significant improvement.    OV 01/07/21 She has had a cough since August 2021 where she was also experiencing shortness of breath and wheezing. The cough was productive at that time and has since become dry. She was  treated initially with antibiotics and steroids with some improvement but the cough persisted and she has subsequently been treated with other rounds of antibiotics and prednisone with out resolution of the cough. She does experience chest pressure along with the cough. She denies any limitations to her daily functioning from dyspnea. Albuterol has provided some relief of the cough when used. She denies any sinus congestion or drainage. She has been prescribed PPI therapy for GERD but has not started these medications. She has elevated the head of her bed over the past year without improvement in the cough. The cough does wake her up about once a week. She reports having a hoarse voice after talking for a while.    She grew up around second hand smoke as her mother was a smoker and had lung cancer.     06/26/2024- Interim hx  Discussed the use of AI scribe software for clinical note transcription with the patient, who gave verbal consent to proceed.  History of Present Illness   Sharon Potter is a 68 year old female who presents with chronic cough.  She has experienced a chronic cough since August 2021, accompanied by intermittent shortness of breath and wheezing. The cough is described as 'kind of comes and goes' and is sometimes triggered by singing or talking, particularly in the afternoon at work or at [employer]. She manages the symptoms with water and  cough drops.  Previous treatments included antibiotics and steroids, which provided some improvement, but the cough persisted. Albuterol has been used with some relief. She was prescribed Breo, which helped with shortness of breath but was not well tolerated. Omeprazole  was also prescribed in the past for the cough, with some improvement noted, but she is not currently taking it.  She has not used her inhalers, Anoro and Flovent , for about a year. She mentions that the cough was 'much better' when she was on these medications.  Her social history  includes exposure to secondhand smoke and a past interest in horseback riding. She denies any history of smoking herself. She also mentions experiencing postnasal drip when around horses.      Allergies  Allergen Reactions   Penicillins Hives, Swelling and Rash    Has patient had a PCN reaction causing immediate rash, facial/tongue/throat swelling, SOB or lightheadedness with hypotension: Yes Has patient had a PCN reaction causing severe rash involving mucus membranes or skin necrosis: Yes Has patient had a PCN reaction that required hospitalization: Yes Has patient had a PCN reaction occurring within the last 10 years: No If all of the above answers are NO, then may proceed with Cephalosporin use.     Peroxyl [Hydrogen Peroxide-Benzy Alc] Swelling and Rash    Severe facial swelling, bumps    Benzoyl Peroxide    Levaquin [Levofloxacin] Other (See Comments)    Caused her tendons in her legs to cramp   Lopressor  [Metoprolol  Tartrate] Other (See Comments)    fatigue   Sulfa Antibiotics Rash    rash    Immunization History  Administered Date(s) Administered   PFIZER(Purple Top)SARS-COV-2 Vaccination 03/02/2020, 04/02/2020    Past Medical History:  Diagnosis Date   Allergy    RAD - PRN ALB MDI    Anxiety    Arrhythmia    history of SVT status post ablation in 2000    Chest pain 11/26/2014   Chronic headache    Depression    no meds   Fibrocystic disease of breast    Hemorrhoids    HSV (herpes simplex virus) infection    outbreaks   IBS (irritable bowel syndrome)    Dr.Robert Rosabel in [place] 2006 colon and EGD normal  CT 2011   Osteopenia    [facility]- 3/13   Ovarian cyst    left followed by Dr Marston    Palpitations 11/26/2014   c/o HR slow and fast, last 2-3 weeks. Had electrical ablation in 2001   Shoulder pain 11/26/2014   Skin melanoma (HCC)    on back superficial only, Dr Ivin   Uterine fibroid    CT scan 1/11    Tobacco History: Social History    Tobacco Use  Smoking Status Never  Smokeless Tobacco Never   Counseling given: Not Answered   Outpatient Medications Prior to Visit  Medication Sig Dispense Refill   alendronate  (FOSAMAX ) 70 MG tablet Take 1 tablet (70 mg total) by mouth once a week. Take with a full glass of water on an empty stomach. 12 tablet 3   escitalopram (LEXAPRO) 10 MG tablet Take 1 tablet by mouth daily.     fluticasone  (FLONASE ) 50 MCG/ACT nasal spray Place 1 spray into both nostrils daily as needed for allergies.      fluticasone  (FLOVENT  HFA) 110 MCG/ACT inhaler Inhale into the lungs 2 (two) times daily.     metoprolol  succinate (TOPROL -XL) 25 MG 24 hr tablet Take 25 mg by mouth  daily.     rosuvastatin (CRESTOR) 10 MG tablet Take 1 tablet (10 mg total) by mouth daily. 15 tablet 0   umeclidinium-vilanterol (ANORO ELLIPTA ) 62.5-25 MCG/ACT AEPB Inhale 1 puff into the lungs daily. 60 each 5   Efinaconazole  (JUBLIA ) 10 % SOLN Apply 1 Application topically daily. (Patient not taking: Reported on 06/26/2024) 8 mL 2   metoprolol  succinate (TOPROL -XL) 50 MG 24 hr tablet Take 1 tablet (50 mg total) by mouth daily. Take with or immediately following a meal. 30 tablet 0   No facility-administered medications prior to visit.   Review of Systems  Review of Systems  Constitutional: Negative.   HENT: Negative.    Respiratory:  Positive for cough.     Physical Exam  BP 114/66 (BP Location: Left Arm, Patient Position: Sitting, Cuff Size: Normal)   Pulse (!) 57   Temp 97.8 F (36.6 C) (Temporal)   Ht 5' 2.5 (1.588 m)   Wt 130 lb 12.8 oz (59.3 kg)   SpO2 97%   BMI 23.54 kg/m  Physical Exam Constitutional:      General: She is not in acute distress.    Appearance: Normal appearance. She is not ill-appearing.  HENT:     Head: Normocephalic and atraumatic.     Mouth/Throat:     Mouth: Mucous membranes are moist.     Pharynx: Oropharynx is clear.   Cardiovascular:     Rate and Rhythm: Regular rhythm.   Pulmonary:     Effort: Pulmonary effort is normal.     Breath sounds: Normal breath sounds.   Musculoskeletal:        General: Normal range of motion.   Skin:    General: Skin is warm and dry.   Neurological:     General: No focal deficit present.     Mental Status: She is alert and oriented to person, place, and time. Mental status is at baseline.   Psychiatric:        Mood and Affect: Mood normal.        Behavior: Behavior normal.        Thought Content: Thought content normal.        Judgment: Judgment normal.      Lab Results:  CBC    Component Value Date/Time   WBC 4.5 03/07/2023 1059   WBC 8.5 12/15/2017 1341   RBC 4.58 03/07/2023 1059   RBC 4.70 12/15/2017 1341   HGB 14.0 03/07/2023 1059   HCT 40.9 03/07/2023 1059   PLT 217 03/07/2023 1059   MCV 89 03/07/2023 1059   MCH 30.6 03/07/2023 1059   MCH 30.6 12/15/2017 1341   MCHC 34.2 03/07/2023 1059   MCHC 34.1 12/15/2017 1341   RDW 13.7 03/07/2023 1059   LYMPHSABS 1.7 03/07/2023 1059   EOSABS 0.2 03/07/2023 1059   BASOSABS 0.1 03/07/2023 1059    BMET    Component Value Date/Time   NA 140 05/17/2024 1458   K 4.1 05/17/2024 1458   CL 105 05/17/2024 1458   CO2 29 05/17/2024 1458   GLUCOSE 99 05/17/2024 1458   BUN 19 05/17/2024 1458   CREATININE 0.86 05/17/2024 1458   CALCIUM 9.3 05/17/2024 1458   GFRNONAA 58 (L) 12/15/2017 1341   GFRAA >60 12/15/2017 1341    BNP No results found for: BNP  ProBNP No results found for: PROBNP  Imaging: No results found.   Assessment & Plan:   1. Mild intermittent reactive airway disease without complication - umeclidinium-vilanterol (ANORO ELLIPTA ) 62.5-25  MCG/ACT AEPB; Inhale 1 puff into the lungs daily.  Dispense: 60 each; Refill: 11  2. Pulmonary nodule (Primary) - CT Chest Wo Contrast; Future  Assessment and Plan    Chronic cough with reactive airway disease Chronic cough since August 2021 with intermittent dyspnea and wheezing. Partial  improvement with previous antibiotics, steroids, and inhalers. Exacerbated by reflux and environmental factors such as singing or talking.  - Refill Anoro and fluticasone  inhalers. - Use Flovent  daily until cough is controlled, then as needed. - Rinse mouth after using Flovent  to prevent thrush. - If Anoro is too expensive, use Flovent  alone and notify via MyChart.  GERD - Start Pepcid 20mg  at bedtime for reflux management.  Secondhand smoke exposure Secondhand smoke exposure contributing to respiratory symptoms and reactive airway disease.  Pulmonary nodule Small pulmonary nodule in the right lower lobe noted on CT scan from 2023. Recommended surveillance for three years to monitor for changes in size. - Order follow-up CT chest to monitor pulmonary nodule.   Almarie LELON Ferrari, NP 06/26/2024

## 2024-06-26 NOTE — Telephone Encounter (Signed)
 Please advise formulary alternative for flovent  inhaler.

## 2024-06-28 ENCOUNTER — Other Ambulatory Visit: Payer: Self-pay | Admitting: Podiatry

## 2024-06-28 MED ORDER — TAVABOROLE 5 % EX SOLN: 1.0000 | Freq: Every day | CUTANEOUS | 2 refills | Status: DC

## 2024-06-28 NOTE — Progress Notes (Signed)
 Sent tavaborole due to cost per pharmacy

## 2024-07-04 ENCOUNTER — Other Ambulatory Visit (HOSPITAL_COMMUNITY): Payer: Self-pay

## 2024-07-04 ENCOUNTER — Telehealth: Payer: Self-pay

## 2024-07-04 NOTE — Telephone Encounter (Signed)
 Preferred alternatives per test claims are as follows:  Arnuity Ellipta: $209.78 Qvar Redihaler: $220.32  *patient has a deductible to meet causing these higher prices at this time.

## 2024-07-05 NOTE — Telephone Encounter (Signed)
 Beth please advise.

## 2024-07-06 ENCOUNTER — Other Ambulatory Visit: Payer: Self-pay

## 2024-07-06 MED ORDER — ARNUITY ELLIPTA 100 MCG/ACT IN AEPB
1.0000 | INHALATION_SPRAY | Freq: Every day | RESPIRATORY_TRACT | 3 refills | Status: DC
Start: 2024-07-06 — End: 2024-09-20

## 2024-07-06 NOTE — Telephone Encounter (Signed)
 Please send in RX ARNUITY 100mcg one puff daily

## 2024-07-10 ENCOUNTER — Ambulatory Visit
Admission: RE | Admit: 2024-07-10 | Discharge: 2024-07-10 | Disposition: A | Discharge status: Home / Self Care | Source: Ambulatory Visit | Attending: Primary Care

## 2024-07-10 DIAGNOSIS — R911 Solitary pulmonary nodule: Secondary | ICD-10-CM

## 2024-07-16 ENCOUNTER — Telehealth: Payer: Self-pay | Admitting: Primary Care

## 2024-07-16 ENCOUNTER — Ambulatory Visit: Payer: Self-pay | Admitting: Primary Care

## 2024-07-16 NOTE — Telephone Encounter (Signed)
 ATC X1. LMTCB

## 2024-07-16 NOTE — Telephone Encounter (Signed)
 Please call patient and let her know CT chest showed unchanged 6mm subpleural nodule within the posterior right lower lobe which appears more solid. I Spoke with Dr. Kara and he would like to refer her to Dr., Shelah to discuss potential biopsy

## 2024-07-16 NOTE — Telephone Encounter (Signed)
 We could also refer to one of the [facility] doctors who do navigational bronch's / Dr. Tamea or Dr. Isadora

## 2024-07-17 ENCOUNTER — Telehealth: Payer: Self-pay | Admitting: *Deleted

## 2024-07-17 NOTE — Telephone Encounter (Signed)
 Copied from CRM [id number]. Topic: Clinical - Lab/Test Results >> Jul 17, 2024  8:34 AM Isabell A wrote: Reason for CRM: Patient Is returning a call from [facility] for her CT results.   Callback number: [phone] >> Jul 17, 2024 10:09 AM Isabell A wrote: Please call patient at [phone]  Duplicate encounter

## 2024-07-17 NOTE — Telephone Encounter (Signed)
 Called mobile number and cell and there was no answer- LMTCB.

## 2024-07-17 NOTE — Telephone Encounter (Signed)
 ATC X3. LMTCB. I will send pt a message via Mychart then completing note per protocol. NFN

## 2024-07-19 ENCOUNTER — Telehealth: Payer: Self-pay

## 2024-07-19 NOTE — Telephone Encounter (Signed)
 Pt called office and I spoke to her. Pt informed of results and we discussed any questions she had. Pt is ok with seeing Dr Shelah. NFN

## 2024-07-19 NOTE — Telephone Encounter (Signed)
 I called and spoke to pt regarding CT results. Pt was informed of results and verbalized understanding. Pt agreed to see Dr Shelah. I will route to the front desk for the to call pt and schedule her with Byrum. Pt stated if she does not answer the phone then it is okay to leave a detailed message of when her appt will be. NFN

## 2024-07-25 ENCOUNTER — Telehealth: Payer: Self-pay | Admitting: Emergency Medicine

## 2024-07-25 NOTE — Telephone Encounter (Signed)
 Copied from CRM [id number]. Topic: Referral - Question >> Jul 25, 2024  9:37 AM Rozanna MATSU wrote: Reason for CRM: PT STATED TO CALL HER CELL [phone] PT CALLING BACK IN REGARDS TO THE MESSAGES TO SEE DR BYRUM PER NOTE FROM 07/21-07/24  SO SHE IS JUST TRYING TO GET THE APPT SCHEDULED. STATED SOMEONE TOLD HER THE FRONT WAS GOING TO GET IT DONE.    Attempted to call patient to get her scheduled with Dr.Byrum.Left voicemail for her to give our office a call.

## 2024-08-23 DIAGNOSIS — J329 Chronic sinusitis, unspecified: Secondary | ICD-10-CM | POA: Diagnosis not present

## 2024-08-23 DIAGNOSIS — R0989 Other specified symptoms and signs involving the circulatory and respiratory systems: Secondary | ICD-10-CM | POA: Diagnosis not present

## 2024-08-23 DIAGNOSIS — R911 Solitary pulmonary nodule: Secondary | ICD-10-CM | POA: Diagnosis not present

## 2024-09-13 DIAGNOSIS — Z139 Encounter for screening, unspecified: Secondary | ICD-10-CM | POA: Diagnosis not present

## 2024-09-13 DIAGNOSIS — Z9181 History of falling: Secondary | ICD-10-CM | POA: Diagnosis not present

## 2024-09-13 DIAGNOSIS — I471 Supraventricular tachycardia, unspecified: Secondary | ICD-10-CM | POA: Diagnosis not present

## 2024-09-13 DIAGNOSIS — M810 Age-related osteoporosis without current pathological fracture: Secondary | ICD-10-CM | POA: Diagnosis not present

## 2024-09-13 DIAGNOSIS — E7800 Pure hypercholesterolemia, unspecified: Secondary | ICD-10-CM | POA: Diagnosis not present

## 2024-09-13 DIAGNOSIS — Z79899 Other long term (current) drug therapy: Secondary | ICD-10-CM | POA: Diagnosis not present

## 2024-09-13 DIAGNOSIS — R911 Solitary pulmonary nodule: Secondary | ICD-10-CM | POA: Diagnosis not present

## 2024-09-13 DIAGNOSIS — D649 Anemia, unspecified: Secondary | ICD-10-CM | POA: Diagnosis not present

## 2024-09-20 ENCOUNTER — Encounter: Payer: Self-pay | Admitting: Emergency Medicine

## 2024-09-20 ENCOUNTER — Ambulatory Visit: Admitting: Emergency Medicine

## 2024-09-20 VITALS — BP 117/70 | HR 56 | Temp 97.6°F | Ht 62.0 in | Wt 132.2 lb

## 2024-09-20 DIAGNOSIS — R918 Other nonspecific abnormal finding of lung field: Secondary | ICD-10-CM | POA: Diagnosis not present

## 2024-09-20 NOTE — Progress Notes (Signed)
 Subjective:    Patient ID: Sharon Potter, female    DOB: 01/09/1956, 68 y.o.   MRN: [id number]  HPI Sharon Potter is 68, a never smoker, with a history of allergies, chronic headaches, SVT ablation, depression, IBS, superficial skin melanoma (resected).  She has been seen in our office for chronic cough and mild obstructive lung disease, pulmonary nodules that have been followed on CT scans of the chest some of which were ground glass and required follow-up, some of which have been stable back to 2014 and were deemed benign.  Her incidental ground glass pulmonary nodules were noted on a high-resolution CT chest 02/27/21 including a 6 mm subpleural right lower lobe nodule, 4 mm dependent right lower lobe nodule.  These were stable on follow-up 03/29/22.  Most recent scan was done on 07/10/2024 as below She reports today that she ran out of Anoro and Flovent  over a year ago and has not restarted anything. Unsure whether she needs to start.   CT chest 07/10/2024 reviewed by me shows the right lower lobe pulmonary nodule still measures 6 mm but may have a slightly increased solid component compared with prior.   Review of Systems As per HPI  Past Medical History:  Diagnosis Date   Allergy    RAD - PRN ALB MDI    Anxiety    Arrhythmia    history of SVT status post ablation in 2000    Chest pain 11/26/2014   Chronic headache    Depression    no meds   Fibrocystic disease of breast    Hemorrhoids    HSV (herpes simplex virus) infection    outbreaks   IBS (irritable bowel syndrome)    Sharon Potter in [place] 2006 colon and EGD normal  CT 2011   Osteopenia    BMD- 3/13   Ovarian cyst    left followed by Sharon Potter    Palpitations 11/26/2014   c/o HR slow and fast, last 2-3 weeks. Had electrical ablation in 2001   Shoulder pain 11/26/2014   Skin melanoma (HCC)    on back superficial only, Sharon Potter   Uterine fibroid    CT scan 1/11    Family History  Problem Relation Age of Onset    Cancer Maternal Aunt        breast   Cancer Maternal Aunt        breast   Cancer Mother        lung   Heart Problems Father        MVR and MVP   Cancer Father        skin   Cancer Maternal Grandmother        breast   Pulmonary embolism Paternal Grandfather    Cancer Daughter        skin   Breast cancer Neg Hx      Social History   Socioeconomic History   Marital status: Married    Spouse name: Not on file   Number of children: Not on file   Years of education: Not on file   Highest education level: Not on file  Occupational History   Not on file  Tobacco Use   Smoking status: Never   Smokeless tobacco: Never  Vaping Use   Vaping status: Never Used  Substance and Sexual Activity   Alcohol use: Yes    Alcohol/week: 0.0 standard drinks of alcohol    Comment: Heavy in the past. Wine 2 glasses  occasionally    Drug use: Not on file   Sexual activity: Not on file  Other Topics Concern   Not on file  Social History Narrative   Tobacco use cigarettes: Never smoked    No smoking   No Tobacco Exposure   Alcohol : yes Heavy in the past: wine 2 glasses    Caffeine: yes   No recreational drug use   No diet   No exercise   Occupation : employed work at [employer] , in [place] in [place],  [place]   Fjmupjo Status  : Married    Children girls 1   Seat belt use :yes   Social Drivers of Corporate investment banker Strain: Not on file  Food Insecurity: Not on file  Transportation Needs: Not on file  Physical Activity: Not on file  Stress: Not on file  Social Connections: Not on file  Intimate Partner Violence: Not on file    Allergies  Allergen Reactions   Penicillins Hives, Swelling and Rash    Has patient had a PCN reaction causing immediate rash, facial/tongue/throat swelling, SOB or lightheadedness with hypotension: Yes Has patient had a PCN reaction causing severe rash involving mucus membranes or skin necrosis: Yes Has patient had a PCN  reaction that required hospitalization: Yes Has patient had a PCN reaction occurring within the last 10 years: No If all of the above answers are NO, then may proceed with Cephalosporin use.     Peroxyl [Hydrogen Peroxide-Benzy Alc] Swelling and Rash    Severe facial swelling, bumps    Benzoyl Peroxide    Levaquin [Levofloxacin] Other (See Comments)    Caused her tendons in her legs to cramp   Lopressor  [Metoprolol  Tartrate] Other (See Comments)    fatigue   Sulfa Antibiotics Rash    rash    Current Outpatient Medications on File Prior to Visit  Medication Sig Dispense Refill   benzonatate (TESSALON) 100 MG capsule Take 100 mg by mouth 3 (three) times daily as needed.     escitalopram (LEXAPRO) 10 MG tablet Take 1 tablet by mouth daily.     fluticasone  (FLONASE ) 50 MCG/ACT nasal spray Place 1 spray into both nostrils daily as needed for allergies.      metoprolol  succinate (TOPROL -XL) 25 MG 24 hr tablet Take 25 mg by mouth daily.     rosuvastatin  (CRESTOR ) 10 MG tablet Take 1 tablet (10 mg total) by mouth daily. 15 tablet 0   umeclidinium-vilanterol (ANORO ELLIPTA ) 62.5-25 MCG/ACT AEPB Inhale 1 puff into the lungs daily. (Patient not taking: Reported on 09/20/2024) 60 each 11   No current facility-administered medications on file prior to visit.         Objective:   Physical Exam  Vitals:   09/20/24 1103  BP: 117/70  Pulse: (!) 56  Temp: 97.6 F (36.4 C)  TempSrc: Temporal  SpO2: 100%  Weight: 132 lb 3.2 oz (60 kg)  Height: 5' 2 (1.575 m)   Gen: Pleasant, well-nourished, in no distress,  normal affect  ENT: No lesions,  mouth clear,  oropharynx clear, no postnasal drip  Neck: No JVD, no stridor  Lungs: No use of accessory muscles, no crackles or wheezing on normal respiration, no wheeze on forced expiration  Cardiovascular: RRR, heart sounds normal, no murmur or gallops, no peripheral edema  Musculoskeletal: No deformities, no cyanosis or clubbing  Neuro:  alert, awake, non focal  Skin: Warm, no lesions or rash  Assessment & Plan:  Pulmonary nodules She has small punctate pulmonary nodules that have been followed over time and deemed benign.  The nodules of interest are right lower lobe ground glass nodules.  The 6 mm right lower lobe nodule may have changed slightly in density.  Have compared the images and any changes very subtle.  We talked today in detail about the possible options at this point including following serial imaging to confirm that the nodule is evolving, navigational bronchoscopy, even referral for surgery and primary resection.  Ultimately primary section may be the best plan.  Based on these discussions we have decided to repeat her CT scan of the chest at the 18-month mark to help determine whether the nodule is evolving.  If so then we will either perform bronchoscopy or refer her to surgery.  We did also discuss PET scan, but I explained to her that at this juncture PET scan will help us  very much due to the size and consistency of the nodule.  Plan for repeat CT 6 months, January 2026.  Time spent 35 minutes reviewing studies, CTs with the patient and her husband.  Discussing options for next steps, making care plan.  Lamar Chris, MD, PhD 09/20/2024, 12:54 PM [facility] [phone] or if no answer before 7:00PM call [phone] For any issues after 7:00PM please call eLink [phone]

## 2024-09-20 NOTE — Patient Instructions (Signed)
 We will plan to repeat your CT chest in January 2026.  Follow with Dr Shelah in January after your CT to review

## 2024-09-20 NOTE — Assessment & Plan Note (Signed)
 She has small punctate pulmonary nodules that have been followed over time and deemed benign.  The nodules of interest are right lower lobe ground glass nodules.  The 6 mm right lower lobe nodule may have changed slightly in density.  Have compared the images and any changes very subtle.  We talked today in detail about the possible options at this point including following serial imaging to confirm that the nodule is evolving, navigational bronchoscopy, even referral for surgery and primary resection.  Ultimately primary section may be the best plan.  Based on these discussions we have decided to repeat her CT scan of the chest at the 68-month mark to help determine whether the nodule is evolving.  If so then we will either perform bronchoscopy or refer her to surgery.  We did also discuss PET scan, but I explained to her that at this juncture PET scan will help us  very much due to the size and consistency of the nodule.  Plan for repeat CT 6 months, January 2026.

## 2024-09-23 ENCOUNTER — Other Ambulatory Visit: Payer: Self-pay | Admitting: Primary Care

## 2024-10-19 ENCOUNTER — Other Ambulatory Visit: Payer: Self-pay | Admitting: Internal Medicine

## 2024-10-23 MED ORDER — METOPROLOL SUCCINATE ER 25 MG PO TB24: 25.0000 mg | ORAL_TABLET | Freq: Every day | ORAL | 0 refills | Status: DC

## 2024-10-29 ENCOUNTER — Other Ambulatory Visit: Payer: Self-pay | Admitting: Podiatry

## 2024-10-29 ENCOUNTER — Telehealth: Payer: Self-pay | Admitting: Internal Medicine

## 2024-10-29 NOTE — Telephone Encounter (Signed)
 Pt has been made aware that she can get her refill for Metoprolol  on her follow-up appointment 11/09/24.  She was appreciative for the call.

## 2024-10-29 NOTE — Telephone Encounter (Signed)
*  STAT* If patient is at the pharmacy, call can be transferred to refill team.   1. Which medications need to be refilled? (please list name of each medication and dose if known) metoprolol  succinate (TOPROL -XL) 25 MG 24 hr tablet   2. Which pharmacy/location (including street and city if local pharmacy) is medication to be sent to?  [id number] - [place], [place] - [address] [facility]    3. Do they need a 30 day or 90 day supply? 90  Patient has appt on 11/14

## 2024-10-30 ENCOUNTER — Other Ambulatory Visit: Payer: Self-pay | Admitting: Family Medicine

## 2024-10-30 DIAGNOSIS — R2232 Localized swelling, mass and lump, left upper limb: Secondary | ICD-10-CM

## 2024-11-05 ENCOUNTER — Ambulatory Visit
Admission: RE | Admit: 2024-11-05 | Discharge: 2024-11-05 | Disposition: A | Discharge status: Home / Self Care | Source: Ambulatory Visit | Attending: Family Medicine | Admitting: Family Medicine

## 2024-11-05 DIAGNOSIS — R2232 Localized swelling, mass and lump, left upper limb: Secondary | ICD-10-CM

## 2024-11-09 ENCOUNTER — Ambulatory Visit: Attending: Student | Admitting: Student

## 2024-11-09 ENCOUNTER — Encounter: Payer: Self-pay | Admitting: Student

## 2024-11-09 VITALS — BP 126/78 | HR 64 | Ht 62.0 in | Wt 130.6 lb

## 2024-11-09 DIAGNOSIS — I471 Supraventricular tachycardia, unspecified: Secondary | ICD-10-CM

## 2024-11-09 DIAGNOSIS — E785 Hyperlipidemia, unspecified: Secondary | ICD-10-CM | POA: Diagnosis not present

## 2024-11-09 DIAGNOSIS — R002 Palpitations: Secondary | ICD-10-CM | POA: Diagnosis not present

## 2024-11-09 MED ORDER — METOPROLOL SUCCINATE ER 25 MG PO TB24: 25.0000 mg | ORAL_TABLET | Freq: Every day | ORAL | 3 refills | Status: AC

## 2024-11-09 NOTE — Patient Instructions (Signed)
 Medication Instructions:  No medication changes today. *If you need a refill on your cardiac medications before your next appointment, please call your pharmacy*  Lab Work: No labwork ordered today. If you have labs (blood work) drawn today and your tests are completely normal, you will receive your results only by: MyChart Message (if you have MyChart) OR A paper copy in the mail If you have any lab test that is abnormal or we need to change your treatment, we will call you to review the results.  Testing/Procedures: No testing ordered today  Follow-Up: At [facility], you and your health needs are our priority.  As part of our continuing mission to provide you with exceptional heart care, our providers are all part of one team.  This team includes your primary Cardiologist (physician) and Advanced Practice Providers or APPs (Physician Assistants and Nurse Practitioners) who all work together to provide you with the care you need, when you need it.  Your next appointment:   12 month(s)  Provider:   You may see Manya Sells, MD or one of the following Advanced Practice Providers on your designated Care Team:   Mertha Abrahams, Kennard Pea "Jonelle Neri" [place], PA-C Suzann Riddle, NP Creighton Doffing, NP    We recommend signing up for the patient portal called "MyChart".  Sign up information is provided on this After Visit Summary.  MyChart is used to connect with patients for Virtual Visits (Telemedicine).  Patients are able to view lab/test results, encounter notes, upcoming appointments, etc.  Non-urgent messages can be sent to your provider as well.   To learn more about what you can do with MyChart, go to [email or web address].

## 2024-11-09 NOTE — Progress Notes (Signed)
  Electrophysiology Office Note:   Date:  11/09/2024  ID:  Sharon Potter, DOB [birth year], MRN [id number]  Primary Cardiologist: None Electrophysiologist: Danelle Birmingham, MD   Electrophysiologist:  Danelle Birmingham, MD      History of Present Illness:   Sharon Potter is a 68 y.o. female with h/o SVT s/p AVNRT ablation 2001 and HLD seen today for routine electrophysiology followup.   Since last being seen in our clinic the patient reports doing well from a cardiac perspective. She has rare tachy-palpitations, just a few times a year now, and is able to rest and have them go away. Otherwise, she denies exertional chest pain, dyspnea, PND, orthopnea, nausea, vomiting, dizziness, syncope, edema, weight gain, or early satiety.   Review of systems complete and found to be negative unless listed in HPI.   EP Information / Studies Reviewed:    EKG is ordered today. Personal review as below.  EKG Interpretation Date/Time:  Friday November 09 2024 10:09:33 EST Ventricular Rate:  64 PR Interval:  122 QRS Duration:  80 QT Interval:  430 QTC Calculation: 443 R Axis:   27  Text Interpretation: Normal sinus rhythm Normal ECG When compared with ECG of 15-Dec-2017 13:57, PREVIOUS ECG IS PRESENT Confirmed by Lesia Sharper [phone]) on 11/09/2024 10:12:42 AM    Arrhythmia/Device History No specialty comments available.   Physical Exam:   VS:  BP 126/78   Pulse 64   Ht 5' 2 (1.575 m)   Wt 130 lb 9.6 oz (59.2 kg)   SpO2 97%   BMI 23.89 kg/m    Wt Readings from Last 3 Encounters:  11/09/24 130 lb 9.6 oz (59.2 kg)  09/20/24 132 lb 3.2 oz (60 kg)  06/26/24 130 lb 12.8 oz (59.3 kg)     GEN: No acute distress NECK: No JVD; No carotid bruits CARDIAC: Regular rate and rhythm, no murmurs, rubs, gallops RESPIRATORY:  Clear to auscultation without rales, wheezing or rhonchi  ABDOMEN: Soft, non-tender, non-distended EXTREMITIES:  No edema; No deformity   ASSESSMENT AND PLAN:    SVT Prior  ablation in 2001 of AVNRT Prior monitoring has showed recurrent SVT in 160s Satisfied overall with her burden Continue toprol  25 mg daily  HLD Continue statin; Will ask PCP to prescribe moving forward.  Cardiac Clearance She is undergoing work up for lung nodules and an arm mass. Given her only cardiac history is SVT, no concerns for any procedures from a cardiac perspective.  She would be at low risk from a cardiac perspective to proceed with any needed surgery without further work up.  If the patient has new chest pain or SOB prior to surgery, she should be revaluated.  Follow up with EP Team in 12 months  Signed, Sharper Prentice Lesia, PA-C

## 2025-01-02 ENCOUNTER — Ambulatory Visit
Admission: RE | Admit: 2025-01-02 | Discharge: 2025-01-02 | Disposition: A | Discharge status: Home / Self Care | Source: Ambulatory Visit | Attending: Emergency Medicine | Admitting: Emergency Medicine

## 2025-01-02 DIAGNOSIS — R918 Other nonspecific abnormal finding of lung field: Secondary | ICD-10-CM

## 2025-01-24 ENCOUNTER — Encounter: Payer: Self-pay | Admitting: Emergency Medicine

## 2025-01-24 ENCOUNTER — Ambulatory Visit: Admitting: Emergency Medicine

## 2025-01-24 VITALS — BP 119/84 | HR 77 | Temp 97.8°F | Ht 62.0 in | Wt 133.2 lb

## 2025-01-24 DIAGNOSIS — R918 Other nonspecific abnormal finding of lung field: Secondary | ICD-10-CM

## 2025-01-24 NOTE — Assessment & Plan Note (Signed)
 CT scan of the chest reviewed with the patient today.  She has scattered punctate solid pulmonary nodules that look consistent with subpleural lymph nodes versus some possible small airways impaction.  There are some new foci noted in the right middle lobe and right upper lobe that will need to be followed for interval change.  If there is a progression of micronodular disease then she may warrant bronchoscopy for BAL and culture data to evaluate for opportunistic infection, mycobacterial disease, etc. with regard to the ground glass pulmonary nodule in the right lower lobe this appears to be stable in the interval.  Will need to continue to follow it as there may have been some increased density on serial scans going back to 2022.  If there is appreciable interval change then she should be a good candidate for wedge resection.  Alternatively we could perform navigational bronchoscopy to get tissue diagnosis prior to surgical referral.

## 2025-01-24 NOTE — Progress Notes (Signed)
 "  Subjective:    Patient ID: Sharon Potter, female    DOB: [birth year], 69 y.o.   MRN: [id number]  HPI Sharon Potter is 52, a never smoker, with a history of allergies, chronic headaches, SVT ablation, depression, IBS, superficial skin melanoma (resected).  She has been seen in our office for chronic cough and mild obstructive lung disease, pulmonary nodules that have been followed on CT scans of the chest some of which were ground glass and required follow-up, some of which have been stable back to 2014 and were deemed benign.  Her incidental ground glass pulmonary nodules were noted on a high-resolution CT chest 02/27/21 including a 6 mm subpleural right lower lobe nodule, 4 mm dependent right lower lobe nodule.  These were stable on follow-up 03/29/22.  Most recent scan was done on 07/10/2024 as below She reports today that she ran out of Anoro and Flovent  over a year ago and has not restarted anything. Unsure whether she needs to start.   CT chest 07/10/2024 reviewed by me shows the right lower lobe pulmonary nodule still measures 6 mm but may have a slightly increased solid component compared with prior.   ROV 01/24/2025 --follow-up visit 69 year old woman, never smoker with mild obstructive lung disease and chronic cough.  I have also followed her for pulmonary nodular disease on CT scan of the chest, some which have been punctate and stable over time and deemed benign.  Also right lower lobe ground glass nodules that have required longer follow-up.  There is a 6 mm right lower lobe nodule that may have changed slightly in density compared with priors.  We repeated her CT as below. PMH: Chronic headaches, SVT ablation, depression, IBS, superficial skin melanoma (resected), chronic cough.  She does still cough some - may be a bit better.   CT scan of the chest 01/02/2025 reviewed by me, showed stable right lower lobe subpleural 7 mm ground glass pulmonary nodule.  Micronodular disease with some interval  development of some right lower lobe micronodularity.  There are also some new subtle bronchial densities in the right middle lobe and right upper lobe   Review of Systems As per HPI  Past Medical History:  Diagnosis Date   Allergy    RAD - PRN ALB MDI    Anxiety    Arrhythmia    history of SVT status post ablation in 2000    Chest pain 11/26/2014   Chronic headache    Depression    no meds   Fibrocystic disease of breast    Hemorrhoids    HSV (herpes simplex virus) infection    outbreaks   IBS (irritable bowel syndrome)    Dr.Jonaya Freshour Rosabel in [place] 2006 colon and EGD normal  CT 2011   Osteopenia    BMD- 3/13   Ovarian cyst    left followed by Dr Marston    Palpitations 11/26/2014   c/o HR slow and fast, last 2-3 weeks. Had electrical ablation in 2001   Shoulder pain 11/26/2014   Skin melanoma (HCC)    on back superficial only, Dr Ivin   Uterine fibroid    CT scan 1/11    Family History  Problem Relation Age of Onset   Cancer Maternal Aunt        breast   Cancer Maternal Aunt        breast   Cancer Mother        lung   Heart Problems Father  MVR and MVP   Cancer Father        skin   Cancer Maternal Grandmother        breast   Pulmonary embolism Paternal Grandfather    Cancer Daughter        skin   Breast cancer Neg Hx      Social History   Socioeconomic History   Marital status: Married    Spouse name: Not on file   Number of children: Not on file   Years of education: Not on file   Highest education level: Not on file  Occupational History   Not on file  Tobacco Use   Smoking status: Never   Smokeless tobacco: Never  Vaping Use   Vaping status: Never Used  Substance and Sexual Activity   Alcohol use: Yes    Alcohol/week: 0.0 standard drinks of alcohol    Comment: Heavy in the past. Wine 2 glasses occasionally    Drug use: Not on file   Sexual activity: Not on file  Other Topics Concern   Not on file  Social History Narrative    Tobacco use cigarettes: Never smoked    No smoking   No Tobacco Exposure   Alcohol : yes Heavy in the past: wine 2 glasses    Caffeine: yes   No recreational drug use   No diet   No exercise   Occupation : employed work at [employer] , [place] in [place],  [place]   F[place]jo Status  : Married    Children girls 1   Seat belt use :yes   Social Drivers of Health   Tobacco Use: Low Risk (01/24/2025)   Patient History    Smoking Tobacco Use: Never    Smokeless Tobacco Use: Never    Passive Exposure: Not on file  Financial Resource Strain: Not on file  Food Insecurity: Not on file  Transportation Needs: Not on file  Physical Activity: Not on file  Stress: Not on file  Social Connections: Not on file  Intimate Partner Violence: Not on file  Depression (EYV7-0): Not on file  Alcohol Screen: Not on file  Housing: Not on file  Utilities: Not on file  Health Literacy: Not on file    Allergies  Allergen Reactions   Penicillins Hives, Swelling and Rash    Has patient had a PCN reaction causing immediate rash, facial/tongue/throat swelling, SOB or lightheadedness with hypotension: Yes Has patient had a PCN reaction causing severe rash involving mucus membranes or skin necrosis: Yes Has patient had a PCN reaction that required hospitalization: Yes Has patient had a PCN reaction occurring within the last 10 years: No If all of the above answers are NO, then may proceed with Cephalosporin use.     Peroxyl [Hydrogen Peroxide-Benzy Alc] Swelling and Rash    Severe facial swelling, bumps    Benzoyl Peroxide    Levaquin [Levofloxacin] Other (See Comments)    Caused her tendons in her legs to cramp   Lopressor  [Metoprolol  Tartrate] Other (See Comments)    fatigue   Sulfa Antibiotics Rash    rash    Current Outpatient Medications on File Prior to Visit  Medication Sig Dispense Refill   escitalopram (LEXAPRO) 10 MG tablet Take 1 tablet by mouth daily.      metoprolol  succinate (TOPROL -XL) 25 MG 24 hr tablet Take 1 tablet (25 mg total) by mouth daily. 90 tablet 3   rosuvastatin  (CRESTOR ) 10 MG tablet Take 1  tablet (10 mg total) by mouth daily. 15 tablet 0   benzonatate (TESSALON) 100 MG capsule Take 100 mg by mouth 3 (three) times daily as needed.     famotidine  (PEPCID ) 20 MG tablet TAKE 1 TABLET BY MOUTH EVERYDAY AT BEDTIME 90 tablet 1   fluticasone  (FLONASE ) 50 MCG/ACT nasal spray Place 1 spray into both nostrils daily as needed for allergies.      Tavaborole  5 % SOLN APPLY 1 APPLICATION TOPICALLY DAILY. 10 mL 1   umeclidinium-vilanterol (ANORO ELLIPTA ) 62.5-25 MCG/ACT AEPB Inhale 1 puff into the lungs daily. 60 each 11   No current facility-administered medications on file prior to visit.         Objective:   Physical Exam  Vitals:   01/24/25 1109  BP: 119/84  Pulse: 77  Temp: 97.8 F (36.6 C)  SpO2: 95%  Weight: 133 lb 4 oz (60.4 kg)  Height: 5' 2 (1.575 m)   Gen: Pleasant, well-nourished, in no distress,  normal affect  ENT: No lesions,  mouth clear,  oropharynx clear, no postnasal drip  Neck: No JVD, no stridor  Lungs: No use of accessory muscles, no crackles or wheezing on normal respiration, no wheeze on forced expiration  Cardiovascular: RRR, heart sounds normal, no murmur or gallops, no peripheral edema  Musculoskeletal: No deformities, no cyanosis or clubbing  Neuro: alert, awake, non focal  Skin: Warm, no lesions or rash      Assessment & Plan:  Pulmonary nodules CT scan of the chest reviewed with the patient today.  She has scattered punctate solid pulmonary nodules that look consistent with subpleural lymph nodes versus some possible small airways impaction.  There are some new foci noted in the right middle lobe and right upper lobe that will need to be followed for interval change.  If there is a progression of micronodular disease then she may warrant bronchoscopy for BAL and culture data to evaluate for  opportunistic infection, mycobacterial disease, etc. with regard to the ground glass pulmonary nodule in the right lower lobe this appears to be stable in the interval.  Will need to continue to follow it as there may have been some increased density on serial scans going back to 2022.  If there is appreciable interval change then she should be a good candidate for wedge resection.  Alternatively we could perform navigational bronchoscopy to get tissue diagnosis prior to surgical referral.   I personally spent a total of 41 minutes in the care of the patient today including preparing to see the patient, getting/reviewing separately obtained history, performing a medically appropriate exam/evaluation, counseling and educating, placing orders, documenting clinical information in the EHR, independently interpreting results, and communicating results.   Lamar Chris, MD, PhD 01/24/2025, 3:47 PM [facility] [phone] or if no answer before 7:00PM call [phone] For any issues after 7:00PM please call eLink [phone]   "

## 2025-01-24 NOTE — Patient Instructions (Addendum)
 We reviewed your CT scan of the chest from January.  This is overall stable compared with your prior.  Good news.  There were some new micronodules seen that we will follow.  We will also follow a stable hazy nodule in the right lower lobe. Please get a repeat CT scan of your chest in July 2026. Follow Dr. Shelah in July so we can review that scan together.  Please call sooner if you have any problems.

## 2025-05-17 ENCOUNTER — Ambulatory Visit: Admitting: Family Medicine

## 2025-07-17 ENCOUNTER — Other Ambulatory Visit
